# Patient Record
Sex: Female | Born: 1980 | Race: Black or African American | Hispanic: No | Marital: Single | Smoking: Current every day smoker
Health system: Southern US, Community
[De-identification: ages and names within clinical notes are randomized; demographics above are authoritative.]

## PROBLEM LIST (undated history)

## (undated) DIAGNOSIS — F1010 Alcohol abuse, uncomplicated: Secondary | ICD-10-CM

## (undated) DIAGNOSIS — E079 Disorder of thyroid, unspecified: Secondary | ICD-10-CM

---

## 2021-08-07 ENCOUNTER — Emergency Department (HOSPITAL_BASED_OUTPATIENT_CLINIC_OR_DEPARTMENT_OTHER): Payer: Self-pay

## 2021-08-07 ENCOUNTER — Encounter (HOSPITAL_BASED_OUTPATIENT_CLINIC_OR_DEPARTMENT_OTHER): Payer: Self-pay | Admitting: Emergency Medicine

## 2021-08-07 ENCOUNTER — Emergency Department (HOSPITAL_COMMUNITY): Payer: Self-pay

## 2021-08-07 ENCOUNTER — Other Ambulatory Visit: Payer: Self-pay

## 2021-08-07 ENCOUNTER — Emergency Department (HOSPITAL_BASED_OUTPATIENT_CLINIC_OR_DEPARTMENT_OTHER)
Admission: EM | Admit: 2021-08-07 | Discharge: 2021-08-08 | Disposition: A | Discharge status: Home / Self Care | Payer: Self-pay | Attending: Emergency Medicine | Admitting: Emergency Medicine

## 2021-08-07 DIAGNOSIS — R7401 Elevation of levels of liver transaminase levels: Secondary | ICD-10-CM | POA: Insufficient documentation

## 2021-08-07 DIAGNOSIS — R7309 Other abnormal glucose: Secondary | ICD-10-CM | POA: Insufficient documentation

## 2021-08-07 DIAGNOSIS — H5350 Unspecified color vision deficiencies: Secondary | ICD-10-CM

## 2021-08-07 DIAGNOSIS — F1010 Alcohol abuse, uncomplicated: Secondary | ICD-10-CM | POA: Insufficient documentation

## 2021-08-07 DIAGNOSIS — R296 Repeated falls: Secondary | ICD-10-CM | POA: Insufficient documentation

## 2021-08-07 DIAGNOSIS — E039 Hypothyroidism, unspecified: Secondary | ICD-10-CM | POA: Insufficient documentation

## 2021-08-07 DIAGNOSIS — R946 Abnormal results of thyroid function studies: Secondary | ICD-10-CM | POA: Insufficient documentation

## 2021-08-07 DIAGNOSIS — I10 Essential (primary) hypertension: Secondary | ICD-10-CM | POA: Insufficient documentation

## 2021-08-07 DIAGNOSIS — R251 Tremor, unspecified: Secondary | ICD-10-CM | POA: Insufficient documentation

## 2021-08-07 DIAGNOSIS — R202 Paresthesia of skin: Secondary | ICD-10-CM | POA: Insufficient documentation

## 2021-08-07 DIAGNOSIS — R200 Anesthesia of skin: Secondary | ICD-10-CM | POA: Insufficient documentation

## 2021-08-07 DIAGNOSIS — H539 Unspecified visual disturbance: Secondary | ICD-10-CM | POA: Insufficient documentation

## 2021-08-07 DIAGNOSIS — F1011 Alcohol abuse, in remission: Secondary | ICD-10-CM

## 2021-08-07 DIAGNOSIS — Y908 Blood alcohol level of 240 mg/100 ml or more: Secondary | ICD-10-CM | POA: Insufficient documentation

## 2021-08-07 HISTORY — DX: Disorder of thyroid, unspecified: E07.9

## 2021-08-07 LAB — HEPATIC FUNCTION PANEL
ALT: 108 U/L — ABNORMAL HIGH (ref 0–44)
AST: 287 U/L — ABNORMAL HIGH (ref 15–41)
Albumin: 3.8 g/dL (ref 3.5–5.0)
Alkaline Phosphatase: 171 U/L — ABNORMAL HIGH (ref 38–126)
Bilirubin, Direct: 0.2 mg/dL (ref 0.0–0.2)
Indirect Bilirubin: 0.4 mg/dL (ref 0.3–0.9)
Total Bilirubin: 0.6 mg/dL (ref 0.3–1.2)
Total Protein: 7.8 g/dL (ref 6.5–8.1)

## 2021-08-07 LAB — CBC WITH DIFFERENTIAL/PLATELET
Abs Immature Granulocytes: 0.01 10*3/uL (ref 0.00–0.07)
Basophils Absolute: 0.1 10*3/uL (ref 0.0–0.1)
Basophils Relative: 2 %
Eosinophils Absolute: 0.3 10*3/uL (ref 0.0–0.5)
Eosinophils Relative: 7 %
HCT: 36.7 % (ref 36.0–46.0)
Hemoglobin: 13 g/dL (ref 12.0–15.0)
Immature Granulocytes: 0 %
Lymphocytes Relative: 46 %
Lymphs Abs: 1.8 10*3/uL (ref 0.7–4.0)
MCH: 37.5 pg — ABNORMAL HIGH (ref 26.0–34.0)
MCHC: 35.4 g/dL (ref 30.0–36.0)
MCV: 105.8 fL — ABNORMAL HIGH (ref 80.0–100.0)
Monocytes Absolute: 0.5 10*3/uL (ref 0.1–1.0)
Monocytes Relative: 12 %
Neutro Abs: 1.3 10*3/uL — ABNORMAL LOW (ref 1.7–7.7)
Neutrophils Relative %: 33 %
Platelets: 176 10*3/uL (ref 150–400)
RBC: 3.47 MIL/uL — ABNORMAL LOW (ref 3.87–5.11)
RDW: 14.6 % (ref 11.5–15.5)
WBC: 3.8 10*3/uL — ABNORMAL LOW (ref 4.0–10.5)
nRBC: 0.5 % — ABNORMAL HIGH (ref 0.0–0.2)

## 2021-08-07 LAB — RAPID URINE DRUG SCREEN, HOSP PERFORMED
Amphetamines: NOT DETECTED
Barbiturates: NOT DETECTED
Benzodiazepines: NOT DETECTED
Cocaine: NOT DETECTED
Opiates: NOT DETECTED
Tetrahydrocannabinol: NOT DETECTED

## 2021-08-07 LAB — CBC
HCT: 36.4 % (ref 36.0–46.0)
Hemoglobin: 13 g/dL (ref 12.0–15.0)
MCH: 37.2 pg — ABNORMAL HIGH (ref 26.0–34.0)
MCHC: 35.7 g/dL (ref 30.0–36.0)
MCV: 104.3 fL — ABNORMAL HIGH (ref 80.0–100.0)
Platelets: 171 10*3/uL (ref 150–400)
RBC: 3.49 MIL/uL — ABNORMAL LOW (ref 3.87–5.11)
RDW: 14.4 % (ref 11.5–15.5)
WBC: 4.1 10*3/uL (ref 4.0–10.5)
nRBC: 0 % (ref 0.0–0.2)

## 2021-08-07 LAB — TSH: TSH: 5.975 u[IU]/mL — ABNORMAL HIGH (ref 0.350–4.500)

## 2021-08-07 LAB — BASIC METABOLIC PANEL
Anion gap: 11 (ref 5–15)
BUN: 5 mg/dL — ABNORMAL LOW (ref 6–20)
CO2: 24 mmol/L (ref 22–32)
Calcium: 9 mg/dL (ref 8.9–10.3)
Chloride: 103 mmol/L (ref 98–111)
Creatinine, Ser: 0.6 mg/dL (ref 0.44–1.00)
GFR, Estimated: >60 mL/min (ref >60)
Glucose, Bld: 106 mg/dL — ABNORMAL HIGH (ref 70–99)
Potassium: 3.9 mmol/L (ref 3.5–5.1)
Sodium: 138 mmol/L (ref 135–145)

## 2021-08-07 LAB — PREGNANCY, URINE: Preg Test, Ur: NEGATIVE

## 2021-08-07 LAB — VITAMIN B12: Vitamin B-12: 910 pg/mL (ref 180–914)

## 2021-08-07 LAB — TROPONIN I (HIGH SENSITIVITY)
Troponin I (High Sensitivity): 3 ng/L (ref <18)
Troponin I (High Sensitivity): 3 ng/L (ref <18)

## 2021-08-07 LAB — ETHANOL: Alcohol, Ethyl (B): 253 mg/dL — ABNORMAL HIGH (ref <10)

## 2021-08-07 LAB — AMMONIA: Ammonia: 16 umol/L (ref 9–35)

## 2021-08-07 IMAGING — MR MR THORACIC SPINE WO/W CM
5 of 9 series · 19 of 48 positions shown · IV contrast (gadavist)
Comparison: None.

CLINICAL DATA: Multiple sclerosis

EXAM:
MRI HEAD WITHOUT AND WITH CONTRAST
MRI CERVICAL SPINE WITHOUT AND WITH CONTRAST
MRI CERVICAL THORACIC WITHOUT AND CONTRAST
CONTRAST:  4.5mL GADAVIST GADOBUTROL 1 MMOL/ML IV SOLN
TECHNIQUE: Multiplanar, multiecho pulse sequences of the brain and surrounding
structures, and cervical and thoracic spine were obtained without
and with intravenous contrast.

[Series 17: T1 · sagittal · 3.0mm · 0.90mm/px · 2 of 16 slices shown (1 of 3)]
[im 1/16]
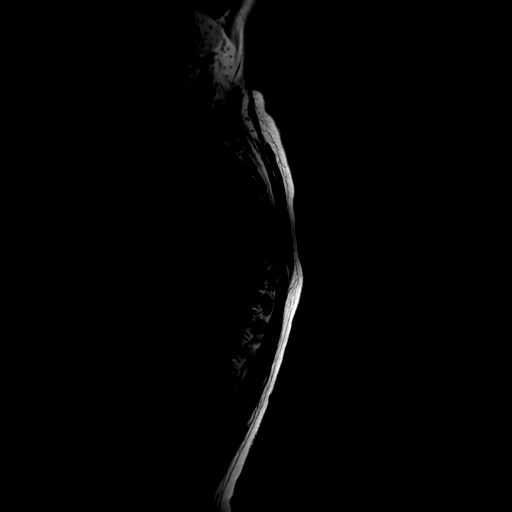
[im 16/16]
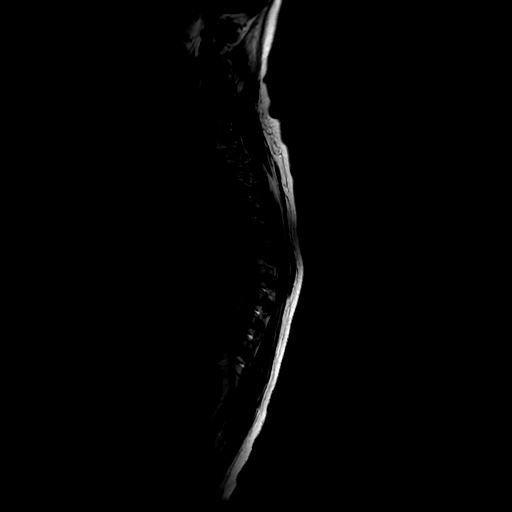

[Series 18: T2 · sagittal · 3.0mm · 0.59mm/px · 2 of 15 slices shown (1 of 2)]
[im 1/15]
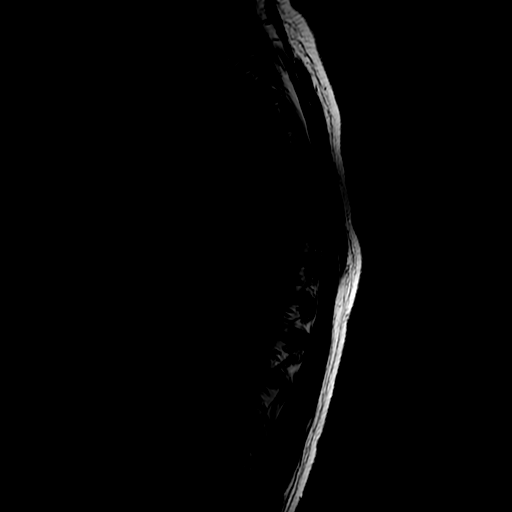
[im 15/15]
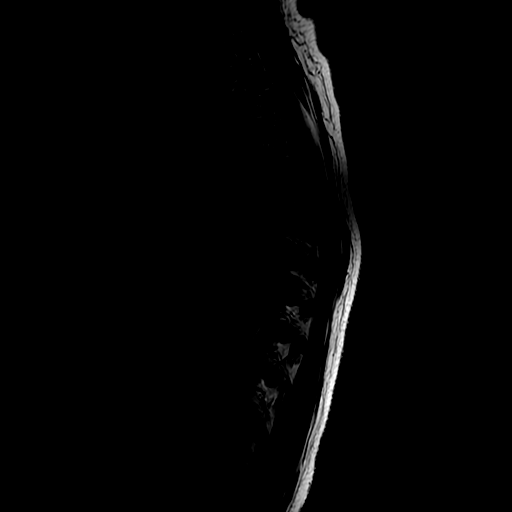

[Series 20: T1 · sagittal · 3.0mm · 0.59mm/px · 3 of 15 slices shown (2 of 3)]
[im 1/15]
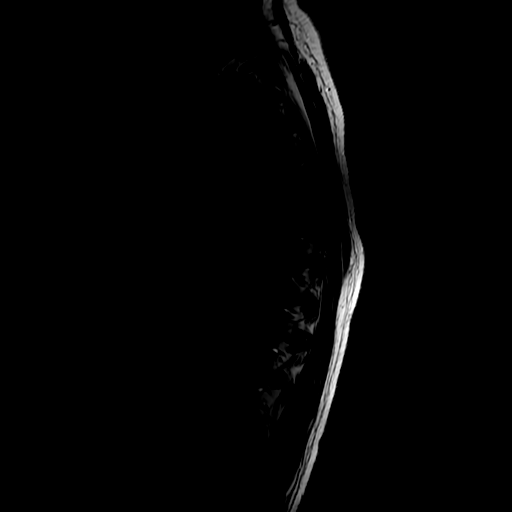
[im 8/15]
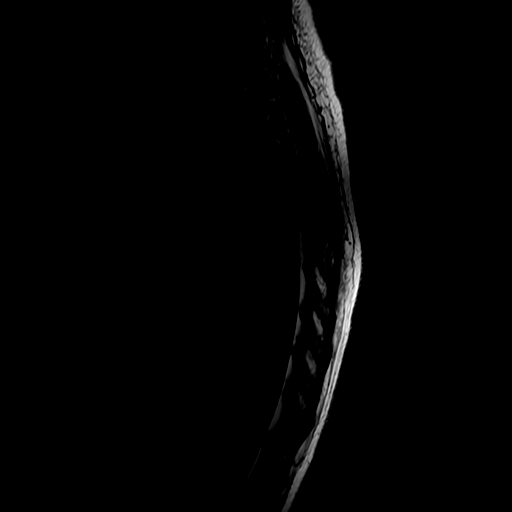
[im 15/15]
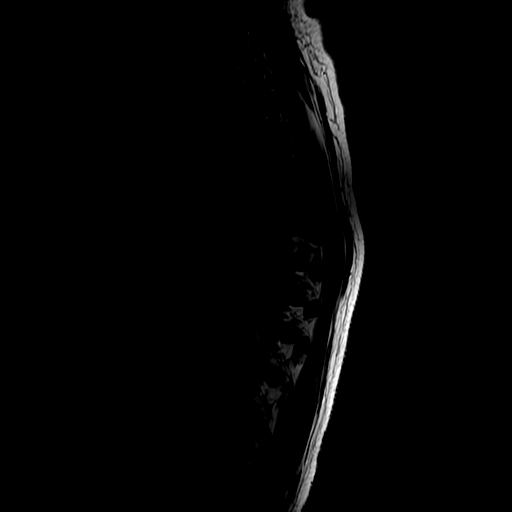

[Series 21: T2 · axial · 4.0mm · 0.39mm/px · z∈[-393,-135]mm · 9 of 49 slices shown (2 of 2)]
[im 1/49]
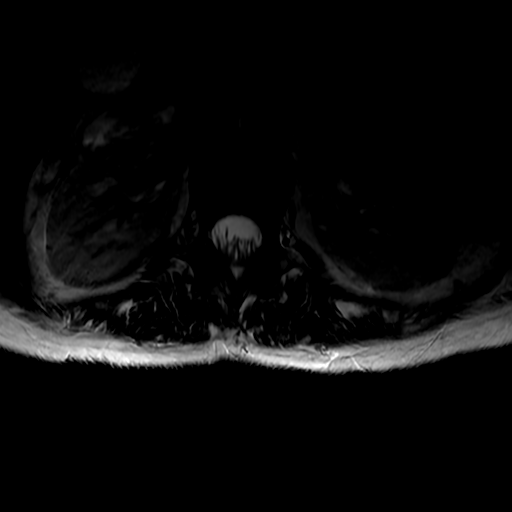
[im 7/49]
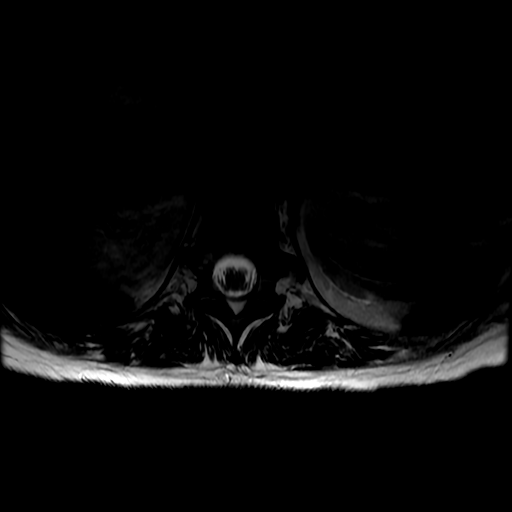
[im 13/49]
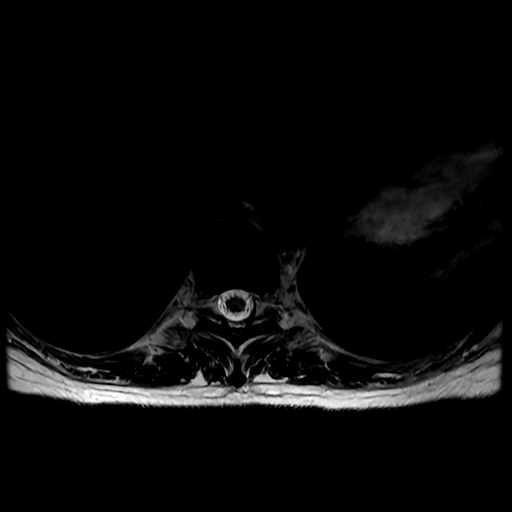
[im 19/49]
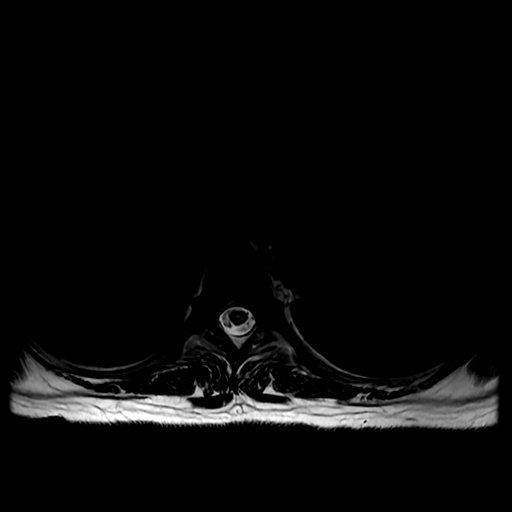
[im 25/49]
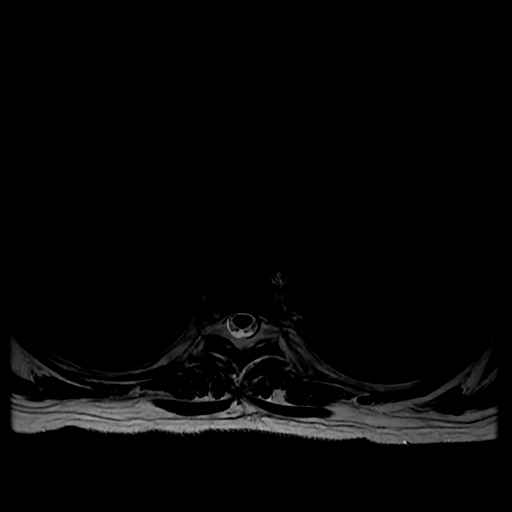
[im 31/49]
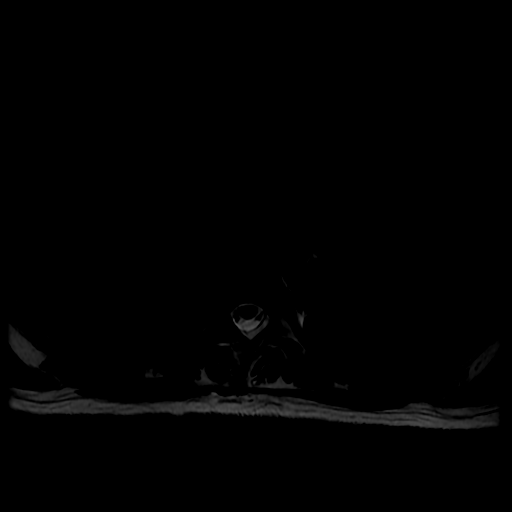
[im 37/49]
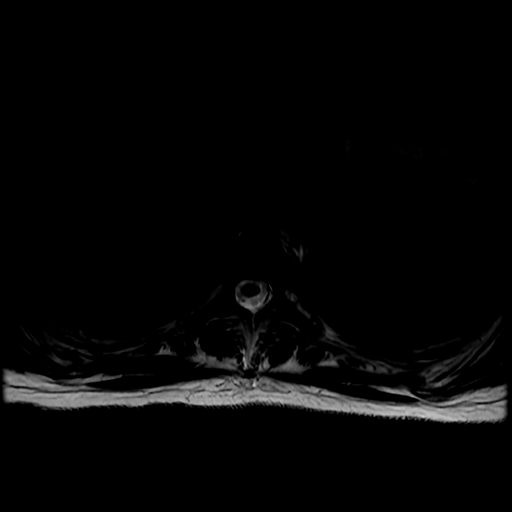
[im 43/49]
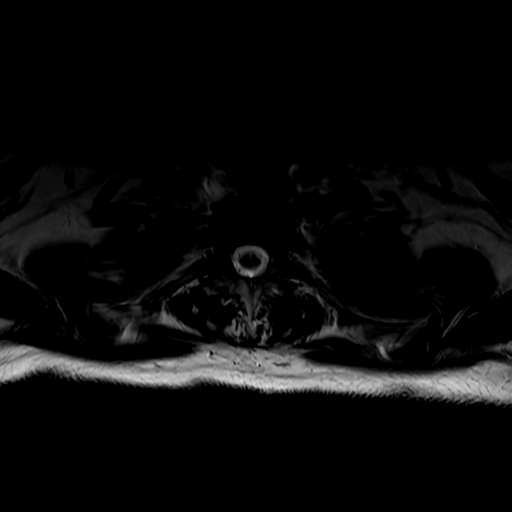
[im 49/49]
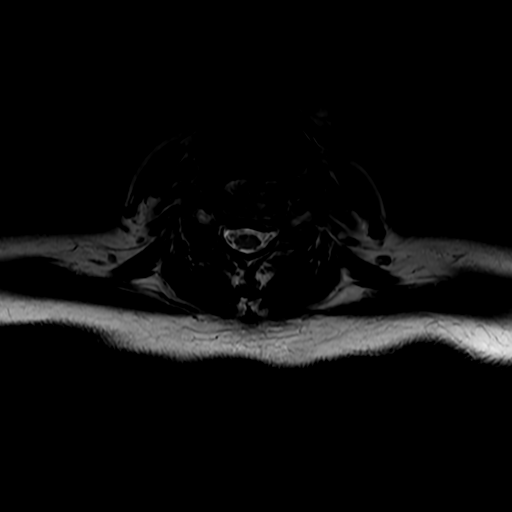

[Series 23: T1 · axial · non-contrast · 4.0mm · 0.39mm/px · z∈[-393,-328]mm · 3 of 49 slices shown (3 of 3)]
[im 1/49]
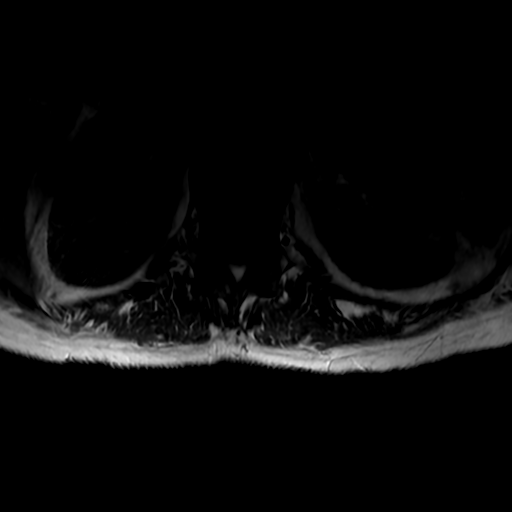
[im 7/49]
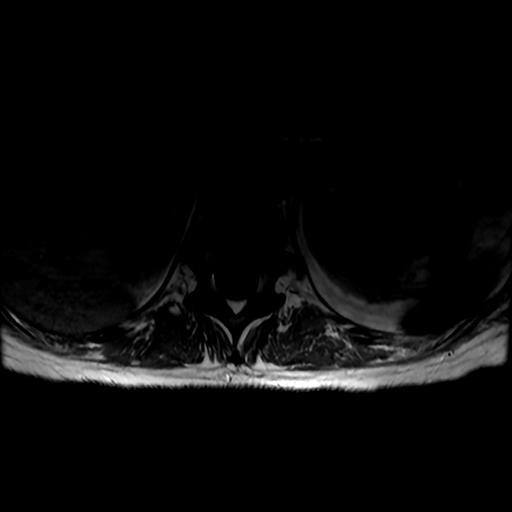
[im 13/49]
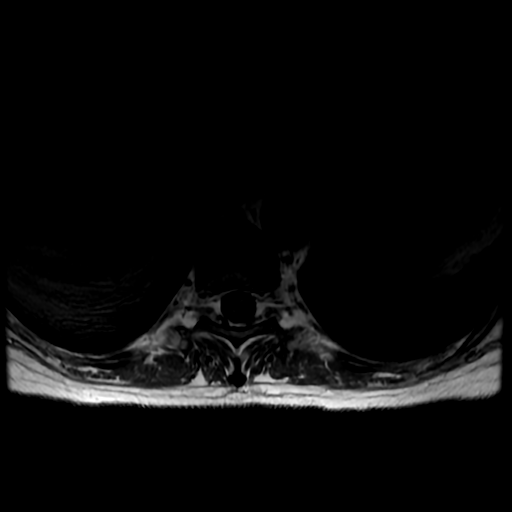

[19 of 48 positions shown; findings below may reference images not displayed]

FINDINGS: MRI HEAD FINDINGS

Brain: No acute infarct, mass effect or extra-axial collection. No
acute or chronic hemorrhage. Normal white matter signal, parenchymal
volume and CSF spaces. The midline structures are normal.

Vascular: Major flow voids are preserved.

Skull and upper cervical spine: Normal calvarium and skull base.
Visualized upper cervical spine and soft tissues are normal.

Sinuses/Orbits:No paranasal sinus fluid levels or advanced mucosal
thickening. No mastoid or middle ear effusion. Normal orbits.

MRI CERVICAL SPINE FINDINGS

Alignment: Reversal of normal cervical lordosis

Vertebrae: Type 1 Modic changes at C5-7.  No acute abnormality.

Cord: Normal signal and morphology.

Posterior Fossa, vertebral arteries, paraspinal tissues: Negative.

Disc levels: There is no spinal canal stenosis or neural
impingement. Degenerative disc disease is greatest at C5-6 and C6-7.

MRI THORACIC SPINE FINDINGS

Alignment:  Physiologic.

Vertebrae: No fracture, evidence of discitis, or bone lesion.

Cord:  Normal signal and morphology.

Paraspinal and other soft tissues: Negative.

Disc levels: No spinal canal or neural foraminal stenosis.
IMPRESSION: 1. Normal MRI of the brain.
2. No evidence of demyelinating disease within the cervical or
thoracic spinal cord.
3. Mild C5-6 and C6-7 degenerative disc disease without spinal canal
or neural foraminal stenosis.

## 2021-08-07 IMAGING — MR MR CERVICAL SPINE WO/W CM
5 of 8 series · 19 of 48 positions shown · IV contrast (gadavist)
Comparison: None.

CLINICAL DATA: Multiple sclerosis

EXAM:
MRI HEAD WITHOUT AND WITH CONTRAST
MRI CERVICAL SPINE WITHOUT AND WITH CONTRAST
MRI CERVICAL THORACIC WITHOUT AND CONTRAST
CONTRAST:  4.5mL GADAVIST GADOBUTROL 1 MMOL/ML IV SOLN
TECHNIQUE: Multiplanar, multiecho pulse sequences of the brain and surrounding
structures, and cervical and thoracic spine were obtained without
and with intravenous contrast.

[Series 10: T2 · sagittal · 3.0mm · 0.31mm/px · 3 of 17 slices shown (1 of 2)]
[im 1/17]
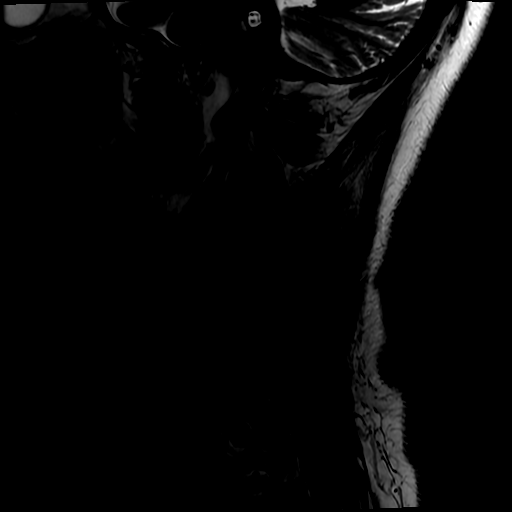
[im 9/17]
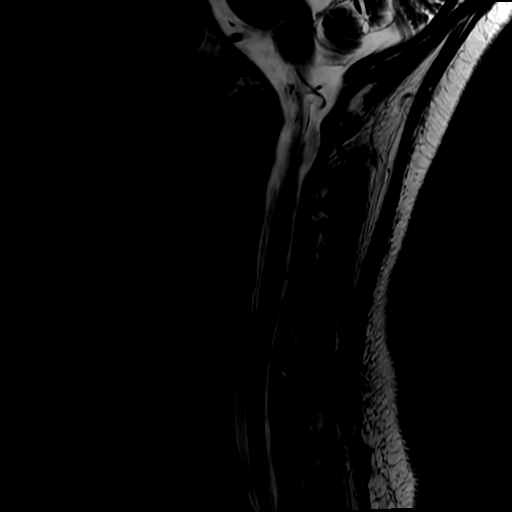
[im 17/17]
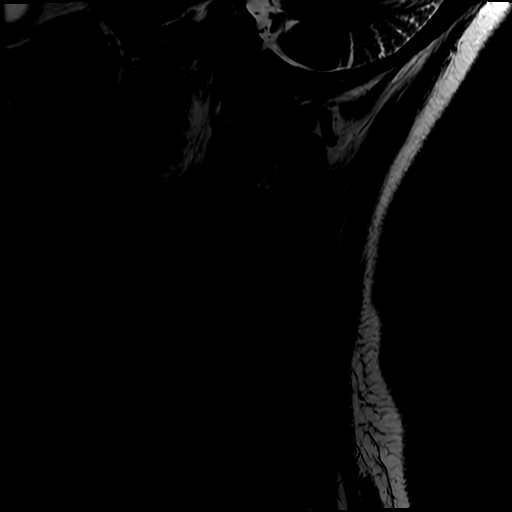

[Series 12: STIR · sagittal · 3.0mm · 0.31mm/px · 1 of 17 slices shown]
[im 1/17]
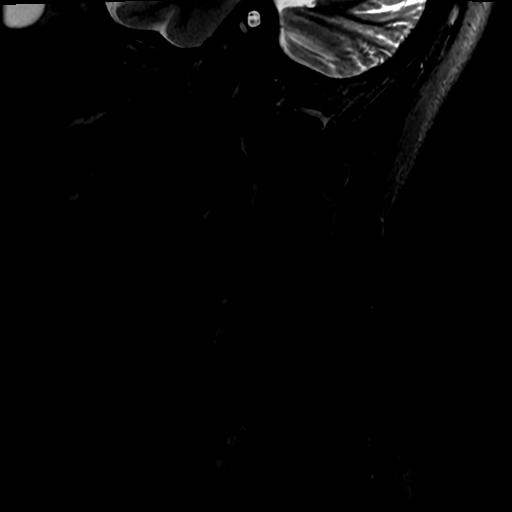

[Series 14: T2 · axial · 3.0mm · 0.35mm/px · z∈[-162,-57]mm · 6 of 32 slices shown (2 of 2)]
[im 1/32]
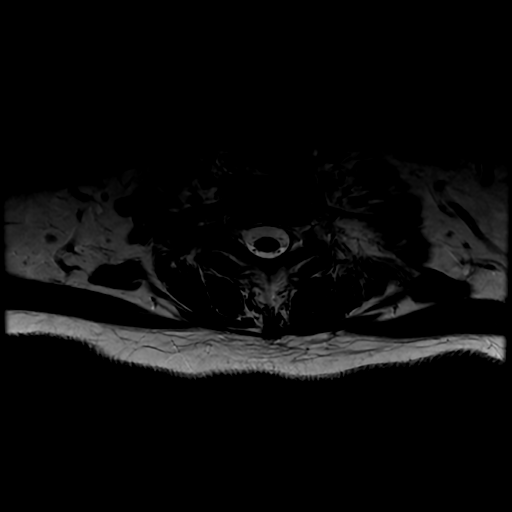
[im 7/32]
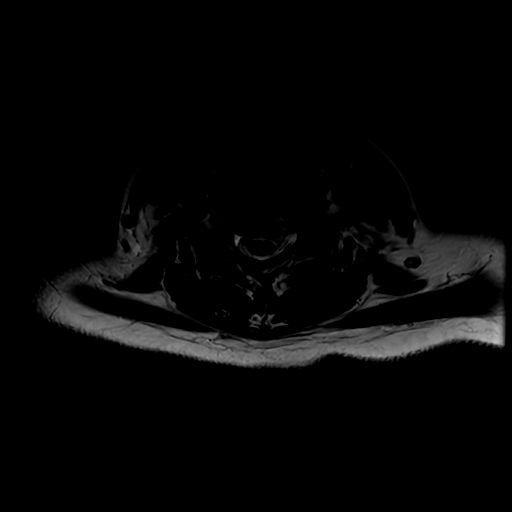
[im 13/32]
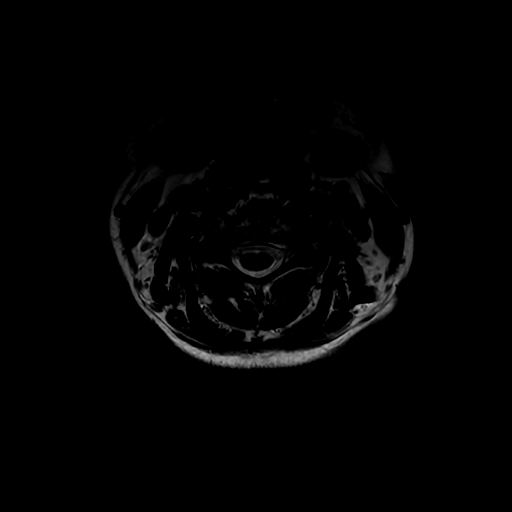
[im 19/32]
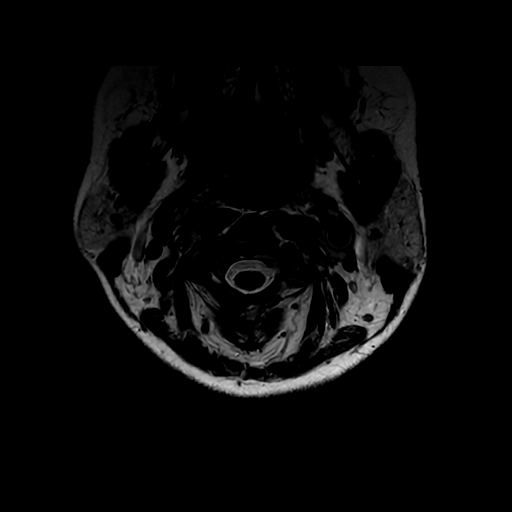
[im 25/32]
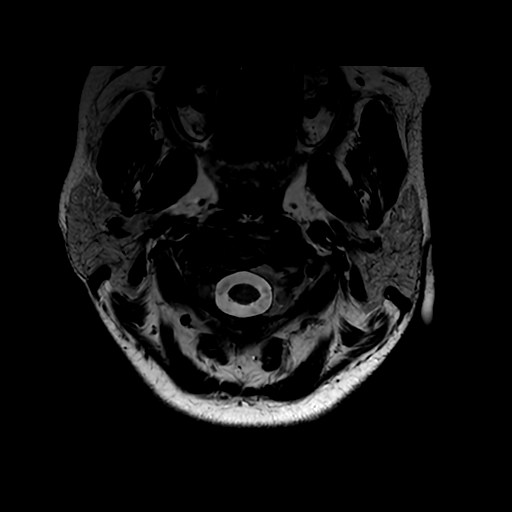
[im 32/32]
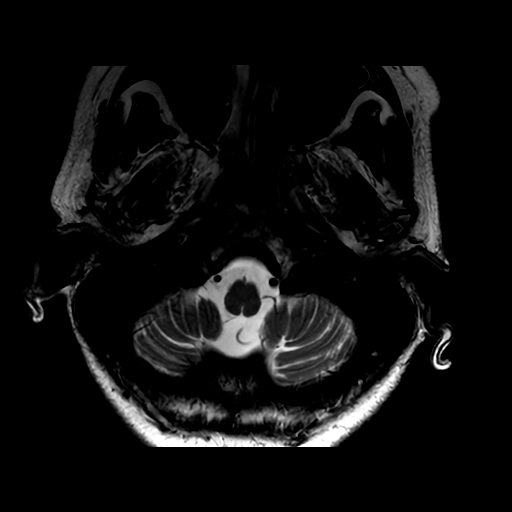

[Series 15: T1 · axial · non-contrast · 3.0mm · 0.35mm/px · z∈[-162,-57]mm · 6 of 34 slices shown]
[im 1/34]
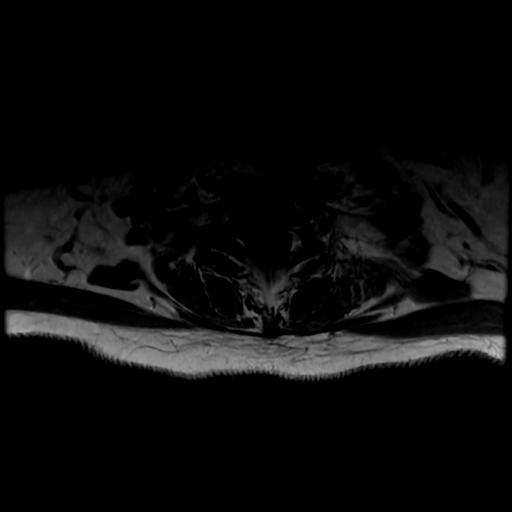
[im 7/34]
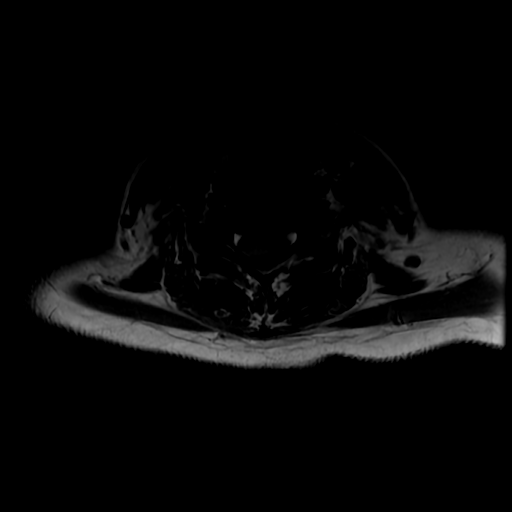
[im 14/34]
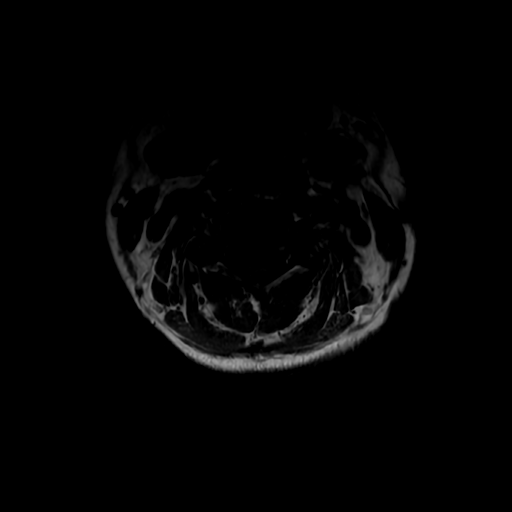
[im 20/34]
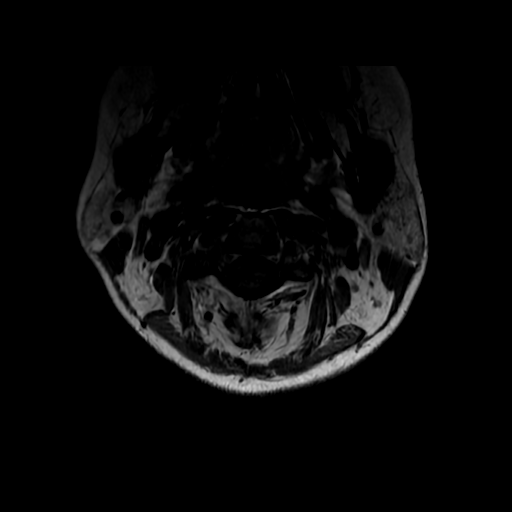
[im 27/34]
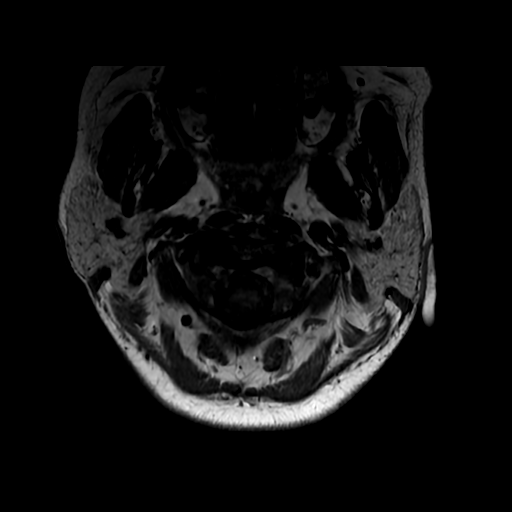
[im 34/34]
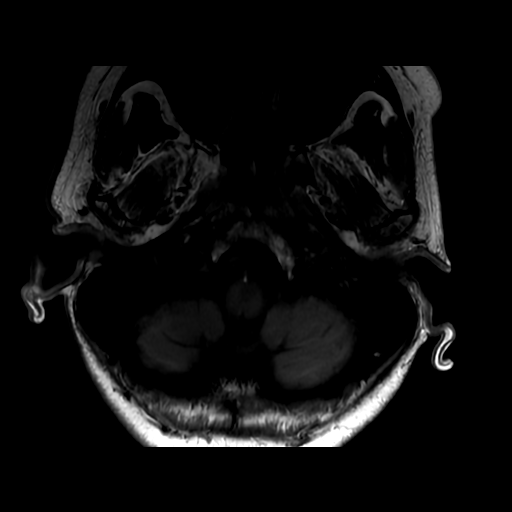

[Series 26: T1 fat-sat post-contrast · sagittal · 3.0mm · 0.31mm/px · 3 of 17 slices shown]
[im 1/17]
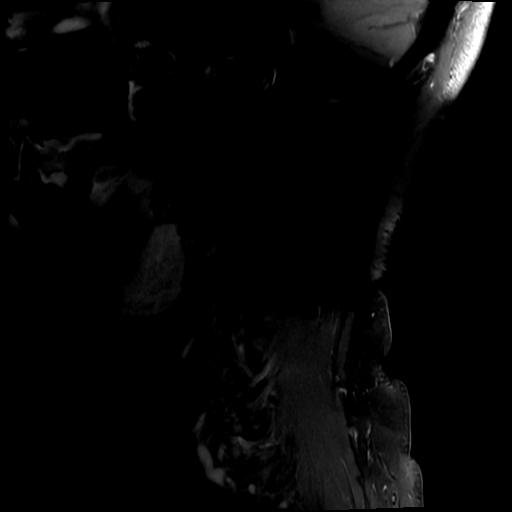
[im 9/17]
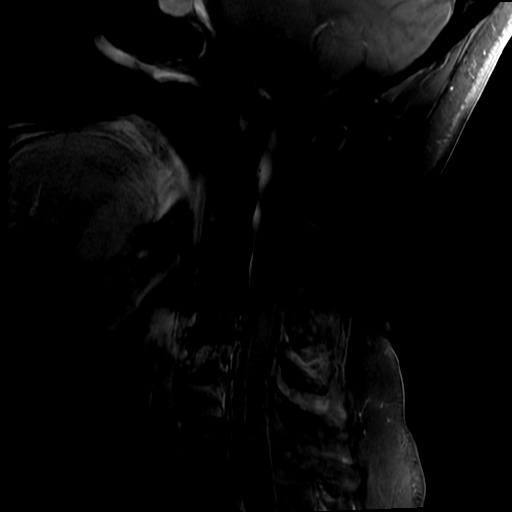
[im 17/17]
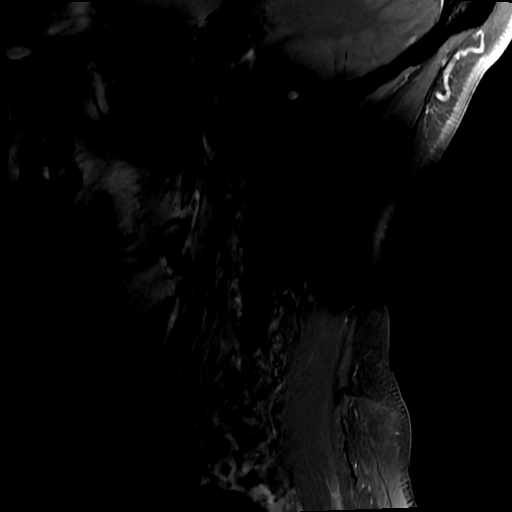

[19 of 48 positions shown; findings below may reference images not displayed]

FINDINGS: MRI HEAD FINDINGS

Brain: No acute infarct, mass effect or extra-axial collection. No
acute or chronic hemorrhage. Normal white matter signal, parenchymal
volume and CSF spaces. The midline structures are normal.

Vascular: Major flow voids are preserved.

Skull and upper cervical spine: Normal calvarium and skull base.
Visualized upper cervical spine and soft tissues are normal.

Sinuses/Orbits:No paranasal sinus fluid levels or advanced mucosal
thickening. No mastoid or middle ear effusion. Normal orbits.

MRI CERVICAL SPINE FINDINGS

Alignment: Reversal of normal cervical lordosis

Vertebrae: Type 1 Modic changes at C5-7.  No acute abnormality.

Cord: Normal signal and morphology.

Posterior Fossa, vertebral arteries, paraspinal tissues: Negative.

Disc levels: There is no spinal canal stenosis or neural
impingement. Degenerative disc disease is greatest at C5-6 and C6-7.

MRI THORACIC SPINE FINDINGS

Alignment:  Physiologic.

Vertebrae: No fracture, evidence of discitis, or bone lesion.

Cord:  Normal signal and morphology.

Paraspinal and other soft tissues: Negative.

Disc levels: No spinal canal or neural foraminal stenosis.
IMPRESSION: 1. Normal MRI of the brain.
2. No evidence of demyelinating disease within the cervical or
thoracic spinal cord.
3. Mild C5-6 and C6-7 degenerative disc disease without spinal canal
or neural foraminal stenosis.

## 2021-08-07 IMAGING — MR MR HEAD WO/W CM
7 of 13 series · 22 of 48 positions shown · IV contrast (gadavist)
Comparison: None.

CLINICAL DATA: Multiple sclerosis

EXAM:
MRI HEAD WITHOUT AND WITH CONTRAST
MRI CERVICAL SPINE WITHOUT AND WITH CONTRAST
MRI CERVICAL THORACIC WITHOUT AND CONTRAST
CONTRAST:  4.5mL GADAVIST GADOBUTROL 1 MMOL/ML IV SOLN
TECHNIQUE: Multiplanar, multiecho pulse sequences of the brain and surrounding
structures, and cervical and thoracic spine were obtained without
and with intravenous contrast.

[Series 2: DWI · axial · 3.0mm · 0.94mm/px · z∈[-74,+60]mm · 7 of 100 slices shown (1 of 2)]
[im 1/100]
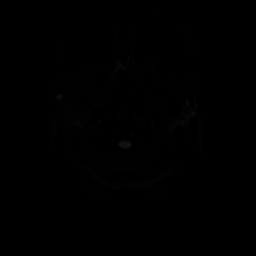
[im 17/100]
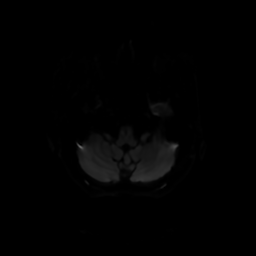
[im 34/100]
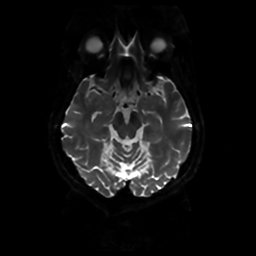
[im 50/100]
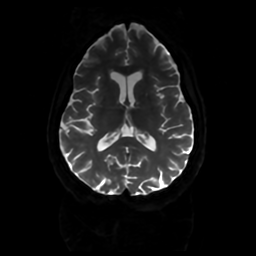
[im 67/100]
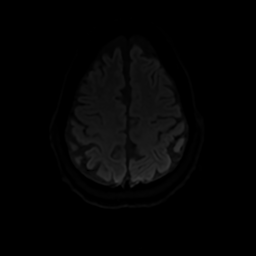
[im 83/100]
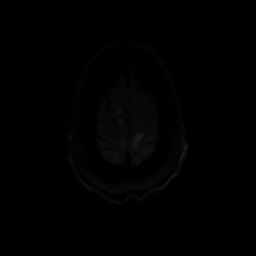
[im 100/100]
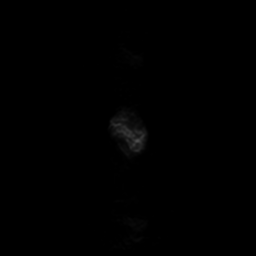

[Series 3: DWI · coronal · 4.0mm · 0.94mm/px · 5 of 74 slices shown (2 of 2)]
[im 1/74]
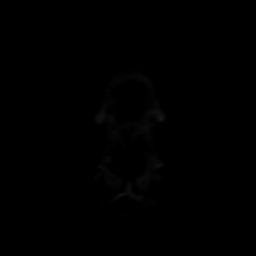
[im 19/74]
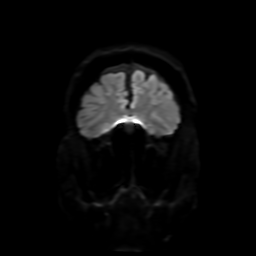
[im 37/74]
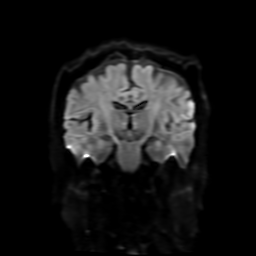
[im 55/74]
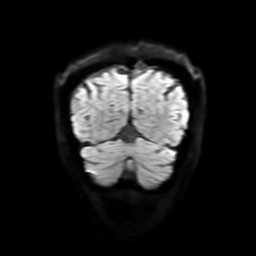
[im 74/74]
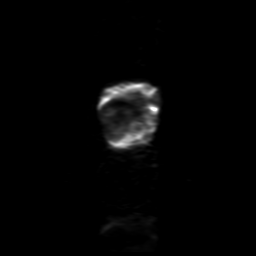

[Series 4: FLAIR · sagittal · 5.0mm · 0.23mm/px · 2 of 24 slices shown (1 of 2)]
[im 1/24]
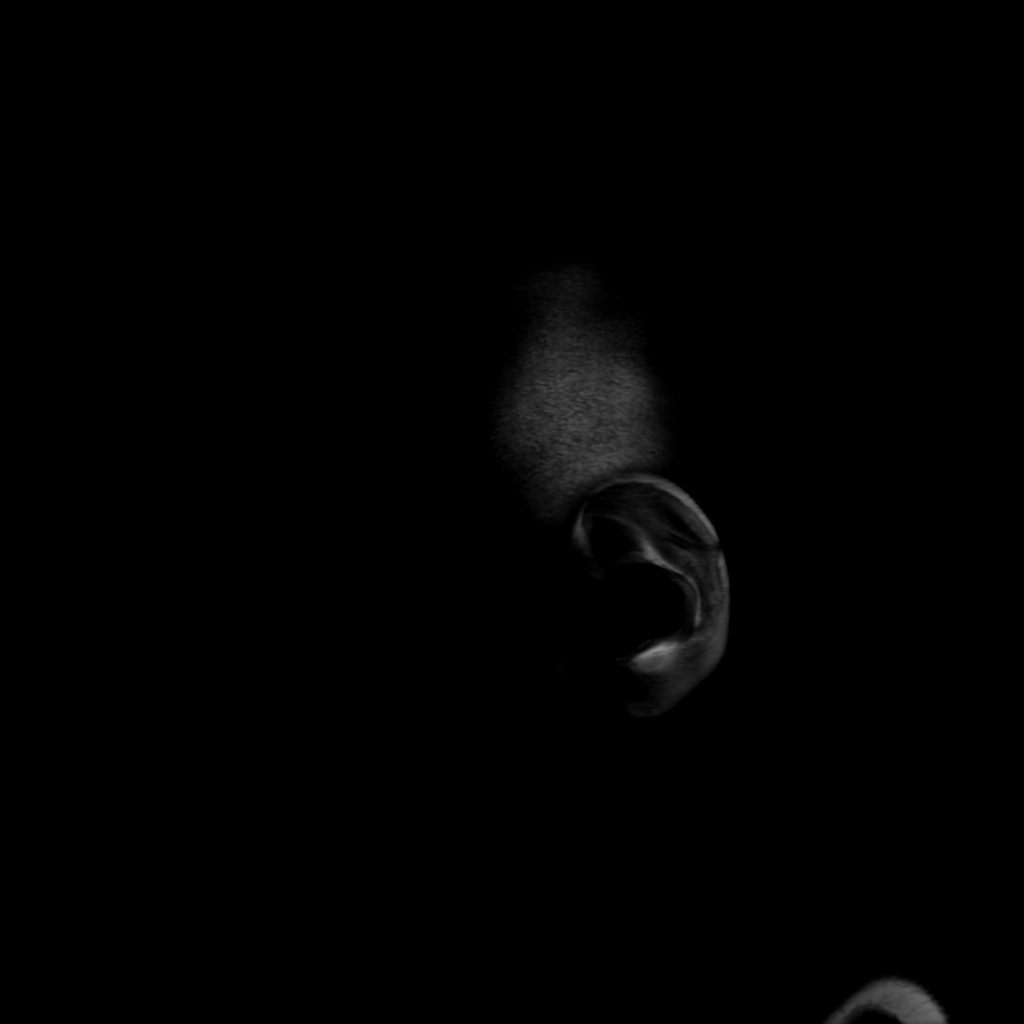
[im 24/24]
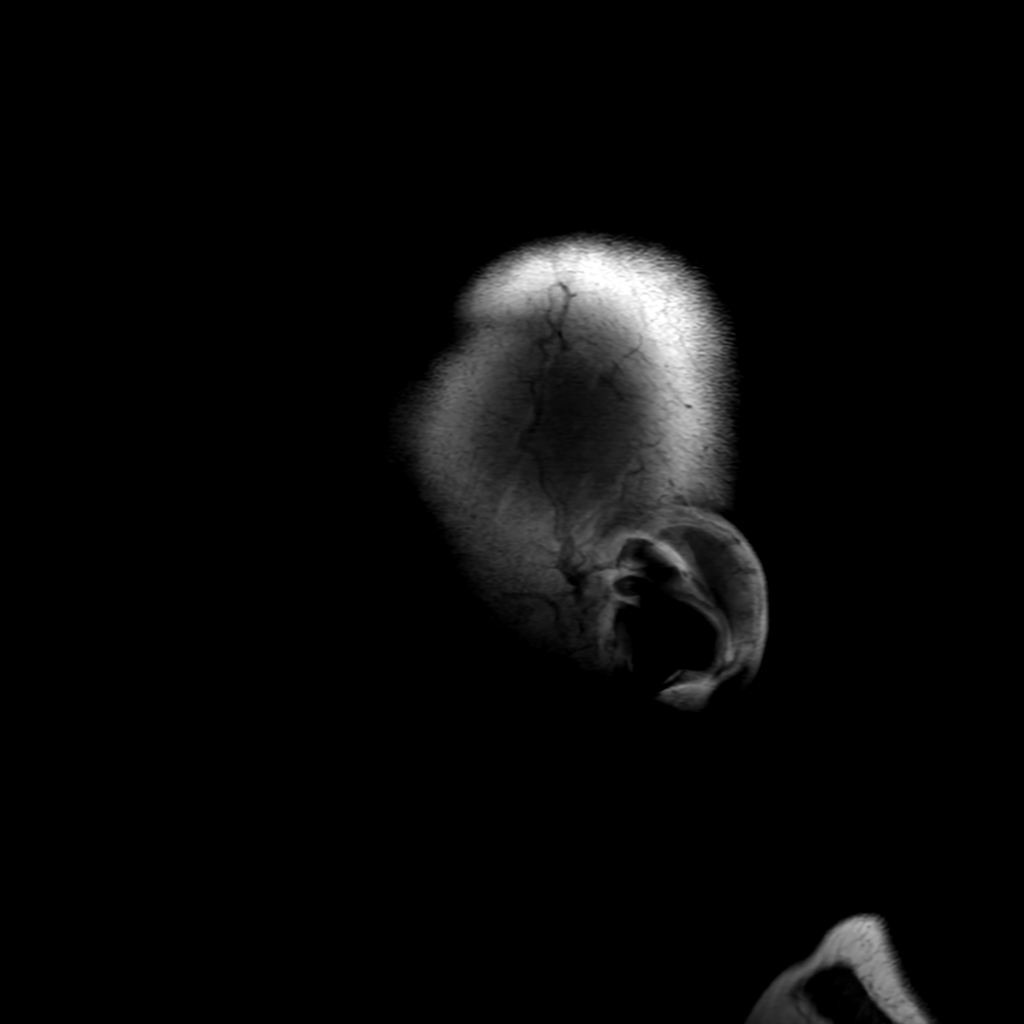

[Series 5: T2 · axial · 5.0mm · 0.23mm/px · 1 of 26 slices shown]
[im 1/26]
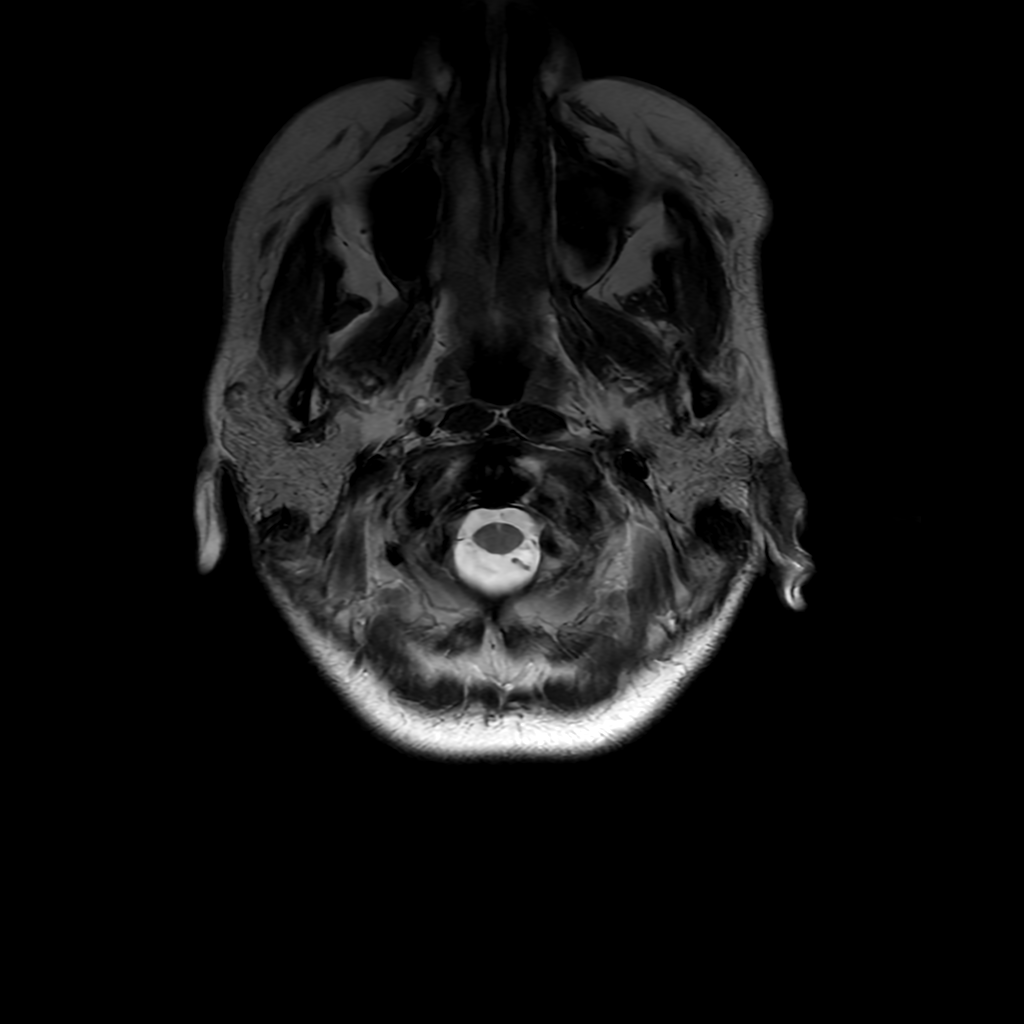

[Series 6: FLAIR · axial · 4.0mm · 0.45mm/px · z∈[-75,+62]mm · 2 of 35 slices shown (2 of 2)]
[im 1/35]
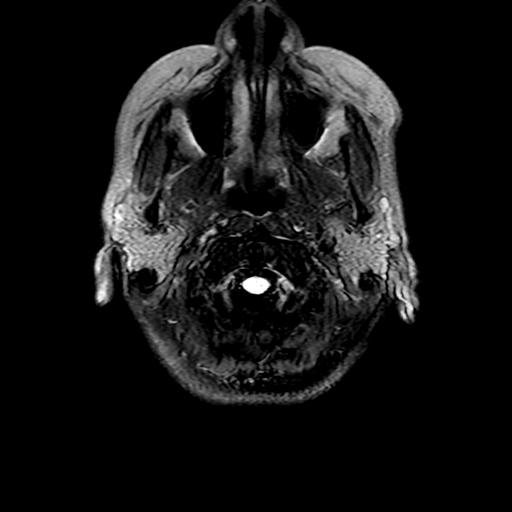
[im 35/35]
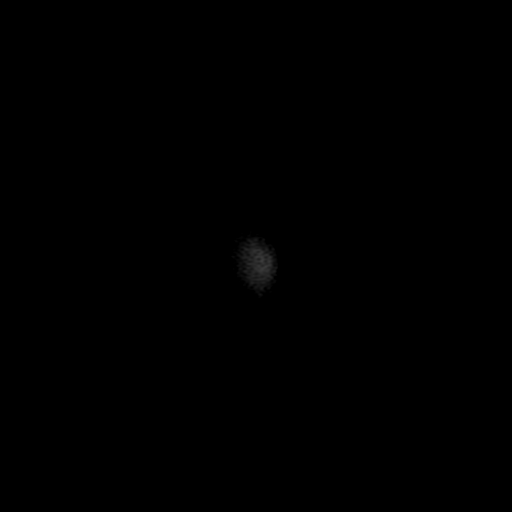

[Series 250: ADC · axial · 3.0mm · 0.94mm/px · z∈[-74,+60]mm · 3 of 50 slices shown (1 of 2)]
[im 1/50]
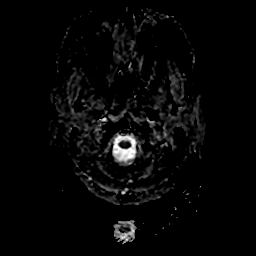
[im 25/50]
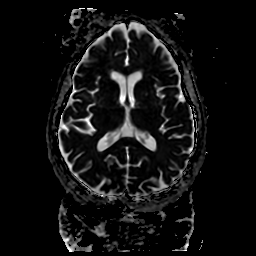
[im 50/50]
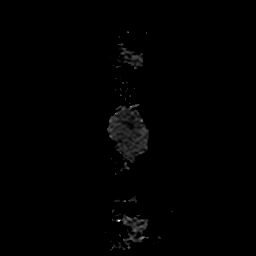

[Series 350: ADC · coronal · 4.0mm · 0.94mm/px · 2 of 37 slices shown (2 of 2)]
[im 1/37]
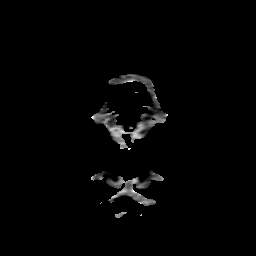
[im 37/37]
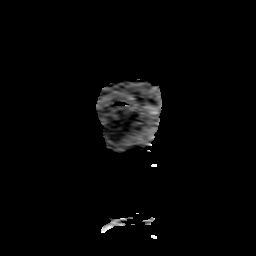

[22 of 48 positions shown; findings below may reference images not displayed]

FINDINGS: MRI HEAD FINDINGS

Brain: No acute infarct, mass effect or extra-axial collection. No
acute or chronic hemorrhage. Normal white matter signal, parenchymal
volume and CSF spaces. The midline structures are normal.

Vascular: Major flow voids are preserved.

Skull and upper cervical spine: Normal calvarium and skull base.
Visualized upper cervical spine and soft tissues are normal.

Sinuses/Orbits:No paranasal sinus fluid levels or advanced mucosal
thickening. No mastoid or middle ear effusion. Normal orbits.

MRI CERVICAL SPINE FINDINGS

Alignment: Reversal of normal cervical lordosis

Vertebrae: Type 1 Modic changes at C5-7.  No acute abnormality.

Cord: Normal signal and morphology.

Posterior Fossa, vertebral arteries, paraspinal tissues: Negative.

Disc levels: There is no spinal canal stenosis or neural
impingement. Degenerative disc disease is greatest at C5-6 and C6-7.

MRI THORACIC SPINE FINDINGS

Alignment:  Physiologic.

Vertebrae: No fracture, evidence of discitis, or bone lesion.

Cord:  Normal signal and morphology.

Paraspinal and other soft tissues: Negative.

Disc levels: No spinal canal or neural foraminal stenosis.
IMPRESSION: 1. Normal MRI of the brain.
2. No evidence of demyelinating disease within the cervical or
thoracic spinal cord.
3. Mild C5-6 and C6-7 degenerative disc disease without spinal canal
or neural foraminal stenosis.

## 2021-08-07 IMAGING — CT CT HEAD W/O CM
3 series · 16 of 47 positions shown, 19 images · non-contrast
Comparison: None.

CLINICAL DATA: Falls, head trauma, vision changes



[Series 2: head wo · axial · 0.41mm/px · z∈[-167,-27]mm · 10 of 34 slices shown, 13 images]
[im 3/34  brain]
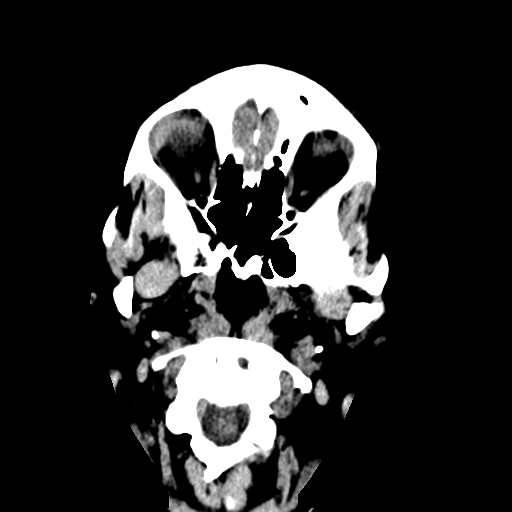
[im 3/34  bone]
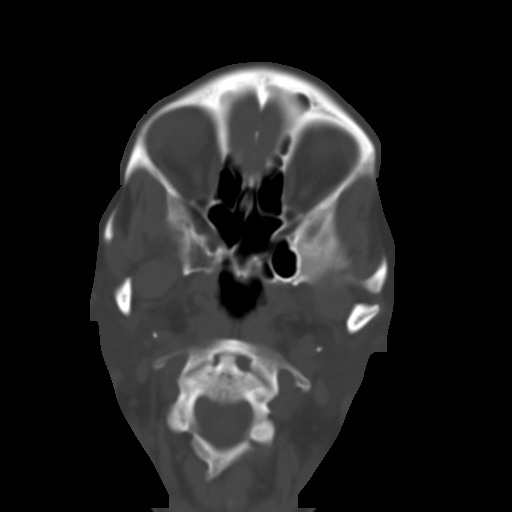
[im 6/34  brain]
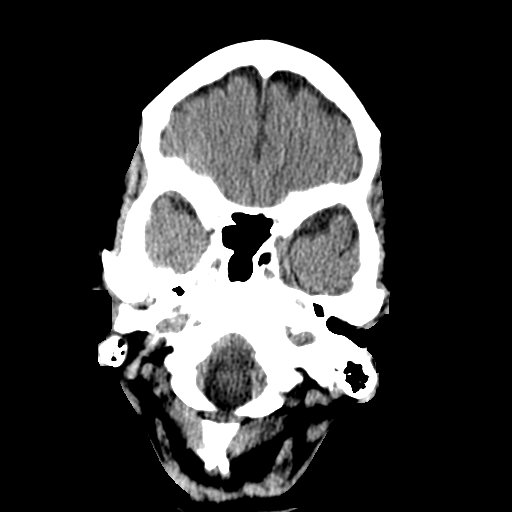
[im 10/34  brain]
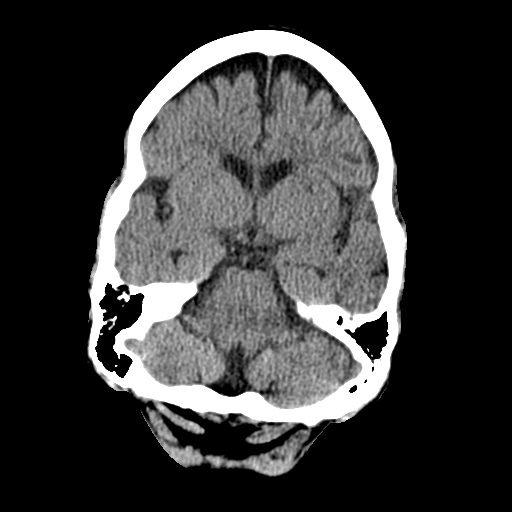
[im 12/34  brain]
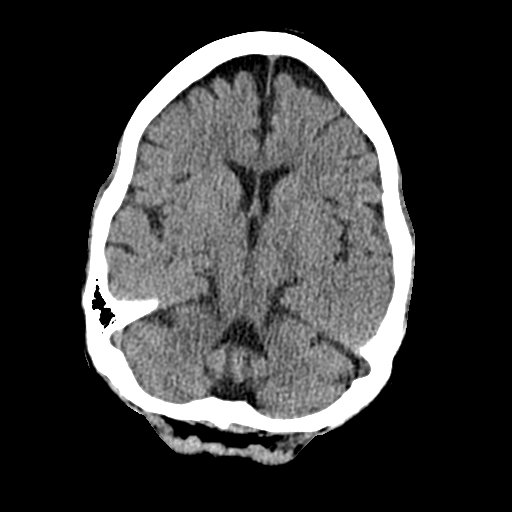
[im 15/34  brain]
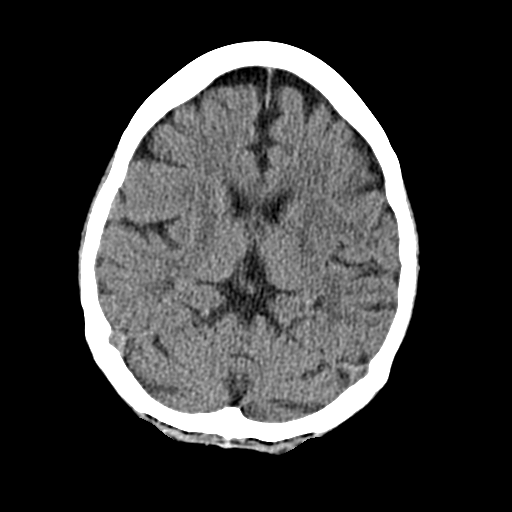
[im 15/34  bone]
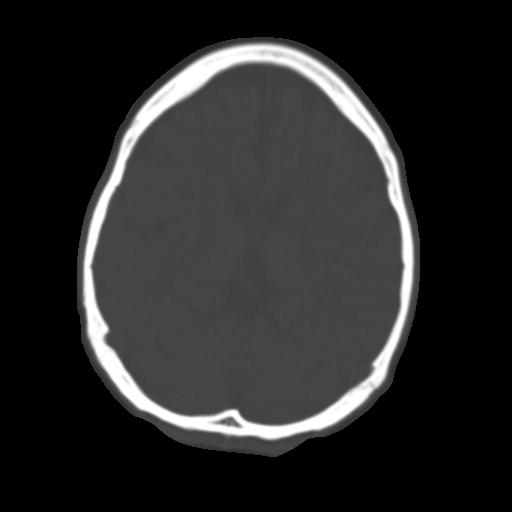
[im 19/34  brain]
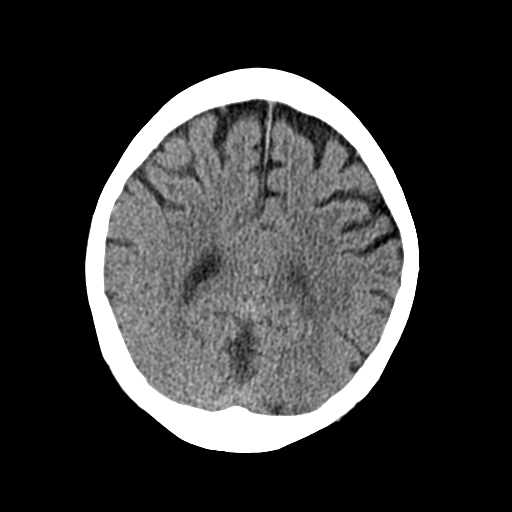
[im 22/34  brain]
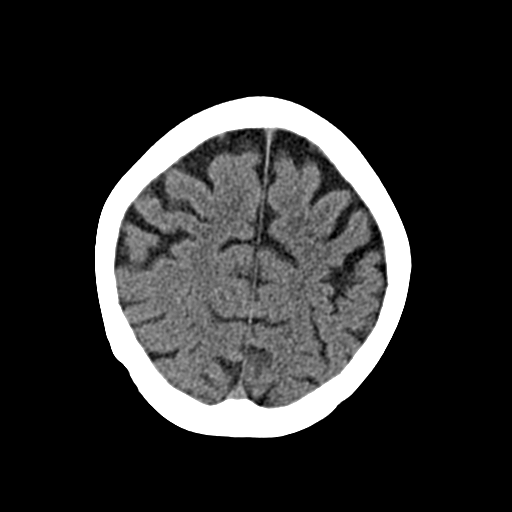
[im 26/34  brain]
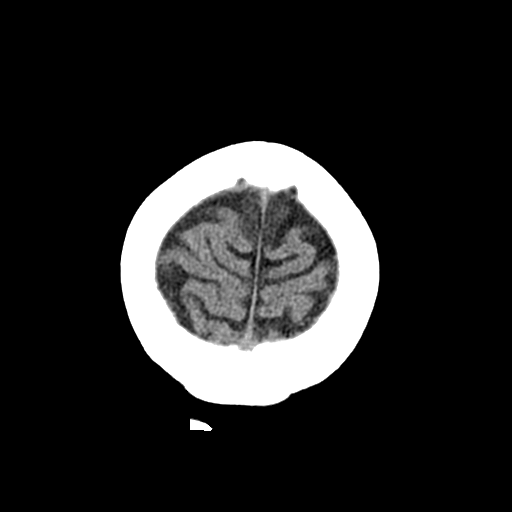
[im 28/34  brain]
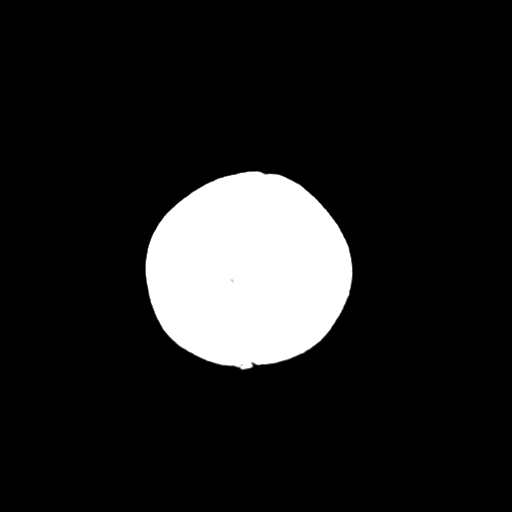
[im 28/34  bone]
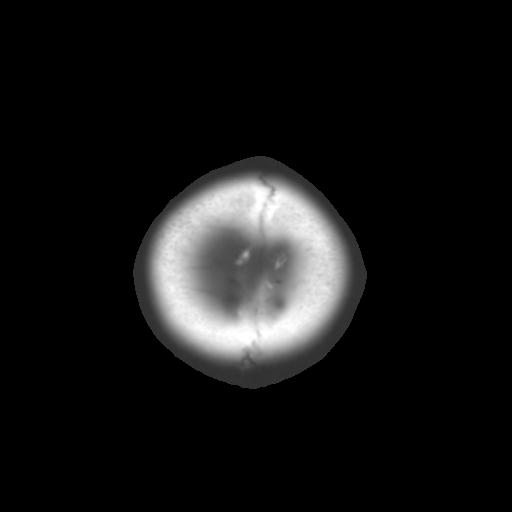
[im 31/34  brain]
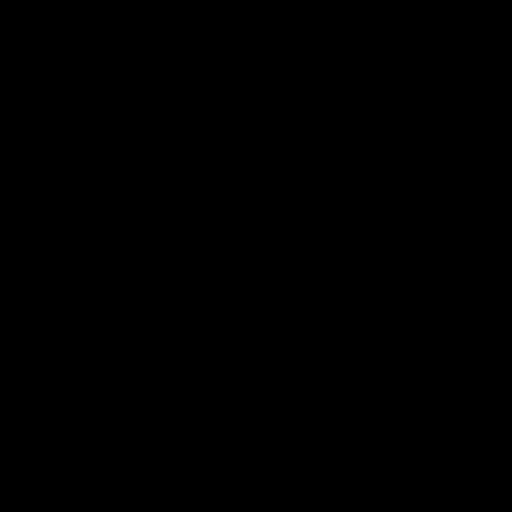

[Series 4: cor soft · coronal · 0.33mm/px · 3 of 64 slices shown]
[im 22/64  brain]
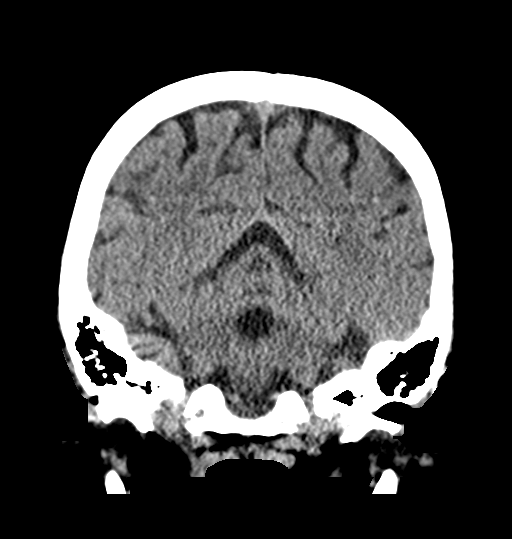
[im 29/64  brain]
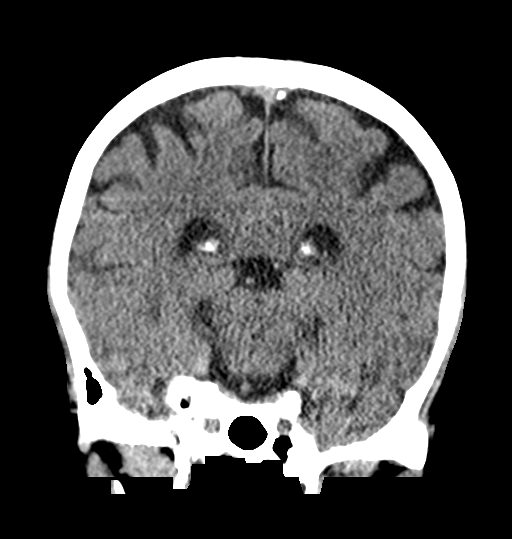
[im 36/64  brain]
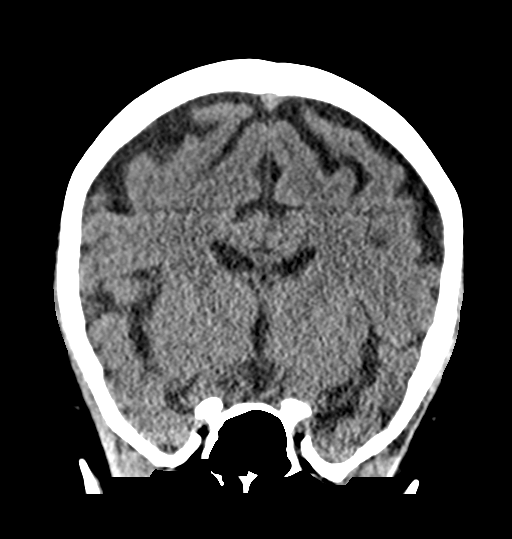

[Series 5: sag soft · sagittal · 0.34mm/px · 3 of 53 slices shown]
[im 18/53  brain]
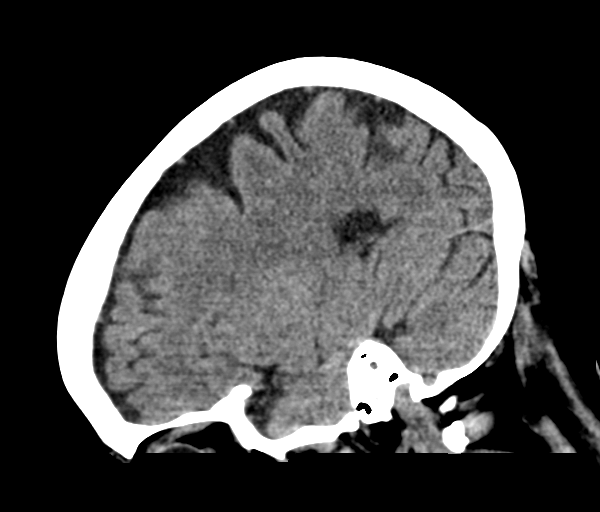
[im 27/53  brain]
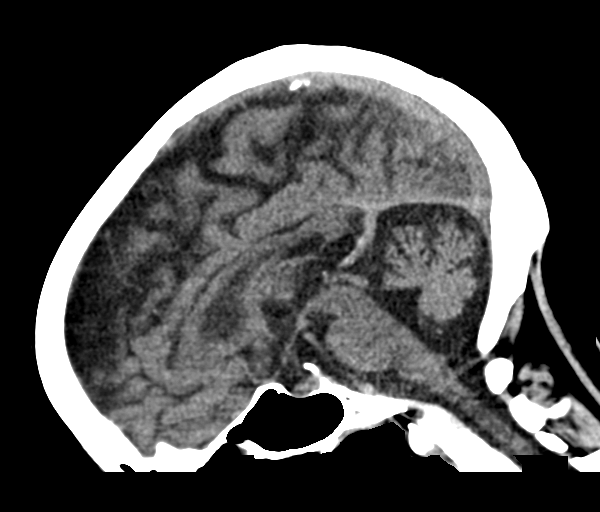
[im 35/53  brain]
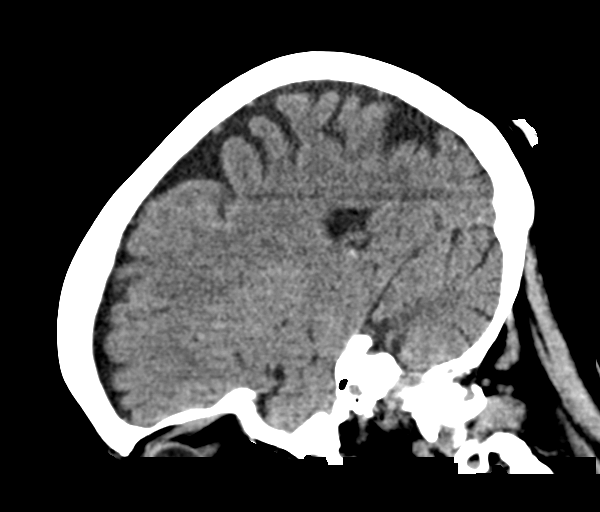

[16 of 47 positions shown; findings below may reference images not displayed]

FINDINGS: Brain: There is no evidence of acute intracranial hemorrhage,
extra-axial fluid collection, or acute infarct.

Parenchymal volume is normal. The ventricles are normal in size.
Gray-white differentiation is preserved.

There is no mass lesion.  There is no mass effect or midline shift.

Vascular: No hyperdense vessel or unexpected calcification.

Skull: Normal. Negative for fracture or focal lesion.

Sinuses/Orbits: Imaged paranasal sinuses are clear. The imaged
globes and orbits are unremarkable.

Other: None.
IMPRESSION: Normal head CT.

## 2021-08-07 MED ORDER — LORAZEPAM 2 MG/ML IJ SOLN
1.0000 mg | Freq: Once | INTRAMUSCULAR | Status: AC
  Administered 2021-08-07: 1 mg via INTRAVENOUS
  Filled 2021-08-07: qty 1

## 2021-08-07 MED ORDER — THIAMINE HCL 100 MG PO TABS: 100.0000 mg | ORAL_TABLET | Freq: Every day | ORAL | Status: DC

## 2021-08-07 MED ORDER — GADOBUTROL 1 MMOL/ML IV SOLN
4.5000 mL | Freq: Once | INTRAVENOUS | Status: AC | PRN
  Administered 2021-08-07: 4.5 mL via INTRAVENOUS

## 2021-08-07 MED ORDER — LORAZEPAM 2 MG/ML IJ SOLN: 0.0000 mg | Freq: Two times a day (BID) | INTRAMUSCULAR | Status: DC

## 2021-08-07 MED ORDER — THIAMINE HCL 100 MG/ML IJ SOLN
100.0000 mg | Freq: Every day | INTRAMUSCULAR | Status: DC
  Administered 2021-08-07: 100 mg via INTRAVENOUS
  Filled 2021-08-07: qty 2

## 2021-08-07 MED ORDER — LORAZEPAM 1 MG PO TABS: 0.0000 mg | ORAL_TABLET | Freq: Four times a day (QID) | ORAL | Status: DC

## 2021-08-07 MED ORDER — LORAZEPAM 2 MG/ML IJ SOLN
0.0000 mg | Freq: Four times a day (QID) | INTRAMUSCULAR | Status: DC
  Administered 2021-08-07: 1 mg via INTRAVENOUS
  Filled 2021-08-07: qty 1

## 2021-08-07 MED ORDER — CHLORDIAZEPOXIDE HCL 25 MG PO CAPS: ORAL_CAPSULE | ORAL | 0 refills | Status: DC

## 2021-08-07 MED ORDER — LORAZEPAM 1 MG PO TABS: 0.0000 mg | ORAL_TABLET | Freq: Two times a day (BID) | ORAL | Status: DC

## 2021-08-07 NOTE — ED Provider Notes (Signed)
[facility] EMERGENCY DEPARTMENT Provider Note   CSN: [id number] Arrival date & time: 08/07/21  1019     History  Chief Complaint  Patient presents with   Multiple Complaints    Kendra Frost is a 41 y.o. female.  HPI     41yo female with history of alcohol abuse, hypothyroidism, presents with concern for progressive bilateral lower extremity numbness, vision changes (looking through fog/lack of color vision.)  2 weeks ago woke up with different vision, can't see color, like can't see colors on the tv, can see some color up close, like can see that bedrail is blue, different vision, color distinction. Last May saw eye doctor, go for regular check ups, glasses, doesn't have hx of macular degeneration  In July of last year began to have numbness in toes, went from uncomfortable to painful, having falls now because of it, thought it would maybe go away but getting worse, now has numbness through shins bilaterally, now hurts to stand, for 2-3 weeks, began to have falls over months, thinks has had concussion, had a bad fall a few months ago, has had multiple falls since then, lives with brother, who feels like she doesn't remember things, is nauseas chronically, not recently having headaches but they do come off an on, sometimes will have migraines with photophobia  No double vision, missing pieces of vision, like a fog in front of vision and color change, black and white  Neck pain, back pain, side pain, for a few months, worse recently some depending on fall.  Left ribs hurt after recent fall 4 days ago.  No shortness of breath, brother reports she sometimes reports it but not sure if from pain.   No fever Cough comes and goes Nausea, vomiting sometimes multiple times a day for month, diarrhea also for months  Today having pain bilateral feet, legs, left side, overall body aches  Hx of thyroid disease on medicine, sometimes misses doses  No known family hx of MS, early  CVA Cigarettes, a few glasses etoh daily No other drugs Has had some itchiness, shaking with etoh withdrawal  Rx folic acid and vitamin D Moved here from [place]-was rx by PCP in [place], moved before thanksgiving     Past Medical History:  Diagnosis Date   Thyroid disease      Home Medications Prior to Admission medications   Not on File      Allergies    Patient has no allergy information on record.    Review of Systems   Review of Systems See above Physical Exam Updated Vital Signs BP 112/84   Pulse 86   Temp 97.7 F (36.5 C) (Oral)   Resp 18   Ht 5\' 1"  (1.549 m)   Wt 47.6 kg   LMP 07/23/2021   SpO2 100%   BMI 19.84 kg/m  Physical Exam Constitutional:      General: She is not in acute distress.    Appearance: Normal appearance. She is not ill-appearing.  HENT:     Head: Normocephalic and atraumatic.  Eyes:     General: No visual field deficit.    Extraocular Movements: Extraocular movements intact.     Conjunctiva/sclera: Conjunctivae normal.     Pupils: Pupils are equal, round, and reactive to light.  Cardiovascular:     Rate and Rhythm: Normal rate and regular rhythm.     Pulses: Normal pulses.  Pulmonary:     Effort: Pulmonary effort is normal. No respiratory distress.  Abdominal:  General: There is no distension.     Tenderness: There is no abdominal tenderness.  Musculoskeletal:        General: Tenderness (left lateral chest wall, no tenderness C/T/L) present. No swelling.     Cervical back: Normal range of motion.  Skin:    General: Skin is warm and dry.     Findings: No erythema or rash.  Neurological:     General: No focal deficit present.     Mental Status: She is alert and oriented to person, place, and time.     GCS: GCS eye subscore is 4. GCS verbal subscore is 5. GCS motor subscore is 6.     Cranial Nerves: No cranial nerve deficit, dysarthria or facial asymmetry.     Sensory: Sensory deficit (bilateral lower extremities up to  knee) present.     Motor: Tremor present. No weakness or pronator drift.     Coordination: Coordination normal. Finger-Nose-Finger Test normal.     Gait: Gait (deferred) normal.     Comments: Normal reflex left patellar, unable to obtain reflex on right patellar    ED Results / Procedures / Treatments   Labs (all labs ordered are listed, but only abnormal results are displayed) Labs Reviewed  BASIC METABOLIC PANEL - Abnormal; Notable for the following components:      Result Value   Glucose, Bld 106 (*)    BUN 5 (*)    All other components within normal limits  CBC - Abnormal; Notable for the following components:   RBC 3.49 (*)    MCV 104.3 (*)    MCH 37.2 (*)    All other components within normal limits  HEPATIC FUNCTION PANEL - Abnormal; Notable for the following components:   AST 287 (*)    ALT 108 (*)    Alkaline Phosphatase 171 (*)    All other components within normal limits  ETHANOL - Abnormal; Notable for the following components:   Alcohol, Ethyl (B) 253 (*)    All other components within normal limits  CBC WITH DIFFERENTIAL/PLATELET - Abnormal; Notable for the following components:   WBC 3.8 (*)    RBC 3.47 (*)    MCV 105.8 (*)    MCH 37.5 (*)    nRBC 0.5 (*)    Neutro Abs 1.3 (*)    All other components within normal limits  PREGNANCY, URINE  AMMONIA  RAPID URINE DRUG SCREEN, HOSP PERFORMED  VITAMIN B1  VITAMIN B12  RPR  TSH  TROPONIN I (HIGH SENSITIVITY)  TROPONIN I (HIGH SENSITIVITY)    EKG EKG Interpretation  Date/Time:  Monday August 07 2021 10:45:58 EDT Ventricular Rate:  91 PR Interval:  160 QRS Duration: 84 QT Interval:  382 QTC Calculation: 469 R Axis:   63 Text Interpretation: Normal sinus rhythm Normal ECG No previous ECGs available Confirmed by Alvira Monday ([id number]) on 08/07/2021 3:42:09 PM  Radiology DG Chest 2 View  Result Date: 08/07/2021 CLINICAL DATA:  Falls, left rib pain EXAM: CHEST - 2 VIEW COMPARISON:  None. FINDINGS: The  cardiomediastinal silhouette is normal. The lungs clear, with no focal consolidation or pulmonary edema. There is no pleural effusion or pneumothorax No displaced rib fracture is identified. IMPRESSION: No radiographic evidence of acute cardiopulmonary process. No displaced rib fracture identified. Electronically Signed   By: Lesia Hausen M.D.   On: 08/07/2021 15:02   CT Head Wo Contrast  Result Date: 08/07/2021 CLINICAL DATA:  Falls, head trauma, vision changes EXAM: CT HEAD WITHOUT  CONTRAST TECHNIQUE: Contiguous axial images were obtained from the base of the skull through the vertex without intravenous contrast. RADIATION DOSE REDUCTION: This exam was performed according to the departmental dose-optimization program which includes automated exposure control, adjustment of the mA and/or kV according to patient size and/or use of iterative reconstruction technique. COMPARISON:  None. FINDINGS: Brain: There is no evidence of acute intracranial hemorrhage, extra-axial fluid collection, or acute infarct. Parenchymal volume is normal. The ventricles are normal in size. Gray-white differentiation is preserved. There is no mass lesion.  There is no mass effect or midline shift. Vascular: No hyperdense vessel or unexpected calcification. Skull: Normal. Negative for fracture or focal lesion. Sinuses/Orbits: Imaged paranasal sinuses are clear. The imaged globes and orbits are unremarkable. Other: None. IMPRESSION: Normal head CT. Electronically Signed   By: Lesia Hausen M.D.   On: 08/07/2021 14:57    Procedures Procedures    Medications Ordered in ED Medications  LORazepam (ATIVAN) injection 1 mg (1 mg Intravenous Given 08/07/21 1611)    ED Course/ Medical Decision Making/ A&P                           Medical Decision Making Amount and/or Complexity of Data Reviewed Labs: ordered. Radiology: ordered.  Risk Prescription drug management.    41yo female with history of alcohol abuse, hypothyroidism,  presents with concern for progressive bilateral lower extremity numbness, vision changes (looking through fog/lack of color vision.)  DDx includes electrolyte abnormality, toxic/metabolic abnormality, vitamin deficiency, ICH, CVA, MS.     Regarding falls and trauma--last fall was 4 days ago.  Does not have midline C-spine tenderness, and denies neck injury or acute lower extremity neurologic change and have low suspicion for acute fracture at this time.  CT head was completed which showed no evidence of intracranial hemorrhage.  Does report some left rib pain, with x-ray completed showing no signs of pneumothorax.  Have low suspicion overall for occult fracture.  Labs are completed and evaluated by me and show no significant electrolyte abnormality.  CBC does show macrocytic RBCs, consistent with possible vitamin B12 or folate deficiency, mild leukopenia.  Troponin is negative as completed given left-sided chest pain which is likely related to rib contusion.  Pregnancy test negative.  Ammonia within normal limits and do not suspect hepatic encephalopathy.  Hepatic function panel shows elevated AST, ALT, and alk phos consistent with history of alcohol abuse.   Do not feel history is consistent with Guillain-Barr given duration and slow progression of numbness.  Did order TSH, vitamin B-12, thiamine, and RPR for further evaluation of her numbness.  While she initially denies any alcohol use today, her alcohol level is 253, and she does report significant use and concern for withdrawal and even developing some shakiness and withdrawal symptoms at time of reevaluation.  Discussed with Dr. Wilford Corner of neurology.  Will transfer to [facility] for an MRI with and without contrast of brain, cervical and thoracic spine.  We will also place CIWA protocol for continued observation.  Discussed recommendation for alcohol cessation and continued discussion.   We will transfer for continued evaluation in the emergency  department and disposition per results and reevaluation. She will require outpatient Neurology and PCP evaluation if determined stable for discharge.            Final Clinical Impression(s) / ED Diagnoses Final diagnoses:  Numbness and tingling of both legs  Visual color changes  History of alcohol  abuse  Falls frequently    Rx / DC Orders ED Discharge Orders     None         Alvira Monday, MD 08/07/21 1622

## 2021-08-07 NOTE — ED Notes (Signed)
Pt remains off the unit in MRI.  

## 2021-08-07 NOTE — Discharge Instructions (Addendum)
You were seen in the ER today for thet ingling in your legs. Your blood work showed some inflammation in your liver likely from your chronic alcohol use.  Your brain scans and the MRIs of your spine were very reassuring.  There is no evidence of any dangerous source for the symptoms you are experiencing.  Please follow-up in the outpatient setting with the neurologist as previously discussed. ?Additionally you have been prescribed Librium, which is a medication to assist you with detoxing from alcohol.  Please take it as prescribed and follow-up closely in the outpatient setting.  ? ?Return to the ER with any new severe symptoms ?

## 2021-08-07 NOTE — ED Provider Notes (Signed)
?[facility] ?Provider Note ? ? ?CSN: [id number] ?Arrival date & time: 08/07/21  1019 ? ?  ? ?History ? ?Chief Complaint  ?Patient presents with  ? Multiple Complaints  ? ? ?Kendra Frost is a 41 y.o. female. ?This patient was received in transfer from [facility] where she was initially evaluated by Dr. Dalene SeltzerSchlossman this evening.  [facility] for MRI of the brain, C-spine, and T-spine. ? ?In brief patient is a 41 year old female with chronic alcohol usage, hypothyroidism who presents with concern for bilateral lower extremity numbness and tingling progressively worsening since July 2022.  States over the last couple weeks she is also had some changes in her vision which she describes as looking through fog and desaturation of color. ? ?I personally reviewed this patient's medical records.  She walks with a cane at baseline and has had tremors for the last several months. ? ?HPI ? ?  ? ?Home Medications ?Prior to Admission medications   ?Medication Sig Start Date End Date Taking? Authorizing Provider  ?chlordiazePOXIDE (LIBRIUM) 25 MG capsule 50mg  PO TID x 1D, then 25-50mg  PO BID X 1D, then 25-50mg  PO QD X 1D 08/07/21  Yes Ximena Todaro R, PA-C  ?   ? ?Allergies    ?Patient has no allergy information on record.   ? ?Review of Systems   ?Review of Systems  ?Eyes:  Positive for visual disturbance.  ?Neurological:  Positive for weakness and numbness.  ?All other systems reviewed and are negative. ? ?Physical Exam ?Updated Vital Signs ?BP (!) 127/106   Pulse 87   Temp 97.7 ?F (36.5 ?C) (Oral)   Resp 16   Ht 5\' 1"  (1.549 m)   Wt 47.6 kg   LMP 07/23/2021   SpO2 100%   BMI 19.84 kg/m?  ?Physical Exam ?Vitals and nursing note reviewed.  ?Constitutional:   ?   Appearance: She is not ill-appearing or toxic-appearing.  ?HENT:  ?   Head: Normocephalic and atraumatic.  ?   Nose: Nose normal.  ?   Mouth/Throat:  ?   Mouth: Mucous membranes are moist.  ?    Pharynx: No oropharyngeal exudate or posterior oropharyngeal erythema.  ?Eyes:  ?   General:     ?   Right eye: No discharge.     ?   Left eye: No discharge.  ?   Extraocular Movements: Extraocular movements intact.  ?   Conjunctiva/sclera: Conjunctivae normal.  ?   Pupils: Pupils are equal, round, and reactive to light.  ?Cardiovascular:  ?   Rate and Rhythm: Normal rate and regular rhythm.  ?   Pulses: Normal pulses.  ?   Heart sounds: Normal heart sounds. No murmur heard. ?Pulmonary:  ?   Effort: Pulmonary effort is normal. No respiratory distress.  ?   Breath sounds: Normal breath sounds. No wheezing or rales.  ?Abdominal:  ?   General: Bowel sounds are normal. There is no distension.  ?   Palpations: Abdomen is soft.  ?   Tenderness: There is no abdominal tenderness. There is no right CVA tenderness, left CVA tenderness, guarding or rebound.  ?Musculoskeletal:     ?   General: No deformity.  ?   Cervical back: Neck supple. No tenderness.  ?   Right lower leg: No edema.  ?   Left lower leg: No edema.  ?Lymphadenopathy:  ?   Cervical: No cervical adenopathy.  ?Skin: ?   General: Skin is  warm and dry.  ?   Capillary Refill: Capillary refill takes less than 2 seconds.  ?Neurological:  ?   General: No focal deficit present.  ?   Mental Status: She is alert and oriented to person, place, and time. Mental status is at baseline.  ?   GCS: GCS eye subscore is 4. GCS verbal subscore is 5. GCS motor subscore is 6.  ?   Cranial Nerves: Cranial nerves 2-12 are intact.  ?   Sensory: Sensation is intact.  ?   Motor: Tremor present. No weakness or pronator drift.  ?   Coordination: Coordination is intact.  ?   Gait: Gait is intact.  ?   Comments: Patient tremulous at baseline in upper and lower extremities.  Neurologic exam is nonfocal.  Strength is symmetric, and sensation is symmetric and normal in lower extremities at time of my evaluation.  Patient is able to ambulate with cane alone without assistance which she states is  her baseline.  ?Psychiatric:     ?   Mood and Affect: Mood normal.  ? ? ?ED Results / Procedures / Treatments   ?Labs ?(all labs ordered are listed, but only abnormal results are displayed) ?Labs Reviewed  ?BASIC METABOLIC PANEL - Abnormal; Notable for the following components:  ?    Result Value  ? Glucose, Bld 106 (*)   ? BUN 5 (*)   ? All other components within normal limits  ?CBC - Abnormal; Notable for the following components:  ? RBC 3.49 (*)   ? MCV 104.3 (*)   ? MCH 37.2 (*)   ? All other components within normal limits  ?HEPATIC FUNCTION PANEL - Abnormal; Notable for the following components:  ? AST 287 (*)   ? ALT 108 (*)   ? Alkaline Phosphatase 171 (*)   ? All other components within normal limits  ?ETHANOL - Abnormal; Notable for the following components:  ? Alcohol, Ethyl (B) 253 (*)   ? All other components within normal limits  ?TSH - Abnormal; Notable for the following components:  ? TSH 5.975 (*)   ? All other components within normal limits  ?CBC WITH DIFFERENTIAL/PLATELET - Abnormal; Notable for the following components:  ? WBC 3.8 (*)   ? RBC 3.47 (*)   ? MCV 105.8 (*)   ? MCH 37.5 (*)   ? nRBC 0.5 (*)   ? Neutro Abs 1.3 (*)   ? All other components within normal limits  ?PREGNANCY, URINE  ?AMMONIA  ?VITAMIN B12  ?RAPID URINE DRUG SCREEN, HOSP PERFORMED  ?VITAMIN B1  ?RPR  ?TROPONIN I (HIGH SENSITIVITY)  ?TROPONIN I (HIGH SENSITIVITY)  ? ? ?EKG ?EKG Interpretation ? ?Date/Time:  Monday August 07 2021 10:45:58 EDT ?Ventricular Rate:  91 ?PR Interval:  160 ?QRS Duration: 84 ?QT Interval:  382 ?QTC Calculation: 469 ?R Axis:   63 ?Text Interpretation: Normal sinus rhythm Normal ECG No previous ECGs available Confirmed by Alvira Monday ([id number]) on 08/07/2021 3:42:09 PM ? ?Radiology ?DG Chest 2 View ? ?Result Date: 08/07/2021 ?CLINICAL DATA:  Falls, left rib pain EXAM: CHEST - 2 VIEW COMPARISON:  None. FINDINGS: The cardiomediastinal silhouette is normal. The lungs clear, with no focal consolidation  or pulmonary edema. There is no pleural effusion or pneumothorax No displaced rib fracture is identified. IMPRESSION: No radiographic evidence of acute cardiopulmonary process. No displaced rib fracture identified. Electronically Signed   By: Lesia Hausen M.D.   On: 08/07/2021 15:02  ? ?CT Head Wo  Contrast ? ?Result Date: 08/07/2021 ?CLINICAL DATA:  Falls, head trauma, vision changes EXAM: CT HEAD WITHOUT CONTRAST TECHNIQUE: Contiguous axial images were obtained from the base of the skull through the vertex without intravenous contrast. RADIATION DOSE REDUCTION: This exam was performed according to the departmental dose-optimization program which includes automated exposure control, adjustment of the mA and/or kV according to patient size and/or use of iterative reconstruction technique. COMPARISON:  None. FINDINGS: Brain: There is no evidence of acute intracranial hemorrhage, extra-axial fluid collection, or acute infarct. Parenchymal volume is normal. The ventricles are normal in size. Gray-white differentiation is preserved. There is no mass lesion.  There is no mass effect or midline shift. Vascular: No hyperdense vessel or unexpected calcification. Skull: Normal. Negative for fracture or focal lesion. Sinuses/Orbits: Imaged paranasal sinuses are clear. The imaged globes and orbits are unremarkable. Other: None. IMPRESSION: Normal head CT. Electronically Signed   By: Lesia Hausen M.D.   On: 08/07/2021 14:57  ? ?MR Brain W and Wo Contrast ? ?Result Date: 08/07/2021 ?CLINICAL DATA:  Multiple sclerosis EXAM: MRI HEAD WITHOUT AND WITH CONTRAST MRI CERVICAL SPINE WITHOUT AND WITH CONTRAST MRI CERVICAL THORACIC WITHOUT AND CONTRAST CONTRAST:  4.68mL GADAVIST GADOBUTROL 1 MMOL/ML IV SOLN TECHNIQUE: Multiplanar, multiecho pulse sequences of the brain and surrounding structures, and cervical and thoracic spine were obtained without and with intravenous contrast. COMPARISON:  None. FINDINGS: MRI HEAD FINDINGS Brain: No acute  infarct, mass effect or extra-axial collection. No acute or chronic hemorrhage. Normal white matter signal, parenchymal volume and CSF spaces. The midline structures are normal. Vascular: Major flow voids are

## 2021-08-07 NOTE — ED Triage Notes (Signed)
Pt arrives pov, slow gait with cane, to triage in wheelchair, with c/o recent mechanical falls, latest being x 4 days. Pt endorses decreased sensation bilaterally of lower extremities, pain bilaterally in feet. Denies blood thinners, denies loc for most recent fall. Reports "changes in vision", inability to see colors.  ?

## 2021-08-07 NOTE — ED Notes (Signed)
Patient transported to MRI 

## 2021-08-07 NOTE — ED Notes (Signed)
ED Provider at bedside. 

## 2021-08-08 LAB — RPR: RPR Ser Ql: NONREACTIVE

## 2021-08-10 LAB — VITAMIN B1: Vitamin B1 (Thiamine): 81.3 nmol/L (ref 66.5–200.0)

## 2021-09-18 ENCOUNTER — Encounter: Payer: Self-pay | Admitting: Diagnostic Neuroimaging

## 2021-09-18 ENCOUNTER — Ambulatory Visit (INDEPENDENT_AMBULATORY_CARE_PROVIDER_SITE_OTHER): Payer: Self-pay | Admitting: Diagnostic Neuroimaging

## 2021-09-18 VITALS — BP 109/77 | HR 89 | Ht 61.0 in | Wt 110.0 lb

## 2021-09-18 DIAGNOSIS — G621 Alcoholic polyneuropathy: Secondary | ICD-10-CM

## 2021-09-18 DIAGNOSIS — F10288 Alcohol dependence with other alcohol-induced disorder: Secondary | ICD-10-CM

## 2021-09-18 NOTE — Patient Instructions (Signed)
?  Alcohol dependence, with neuropathy and ataxia ?- refer to behavioral health for alcohol dependence treatment ?- refer to internal medicine ?- start daily multi-vitamin ?

## 2021-09-18 NOTE — Progress Notes (Signed)
? ?[facility] ? ?PATIENT: Kendra Frost ?DOB: February 10, 1981 ? ?REFERRING CLINICIAN: No ref. provider found ?HISTORY FROM: patient  ?REASON FOR VISIT: new consult  ? ? ?HISTORICAL ? ?CHIEF COMPLAINT:  ?Chief Complaint  ?Patient presents with  ? Numbness  ?  Rm 6 alone ?Pt is well, has been having numbness and tinging in both legs. Started in toes and has moved up her legs recently.  ?Also mention vision changes.   ? ? ?HISTORY OF PRESENT ILLNESS:  ? ?41 year old female here for evaluation of gait mobility difficulties, brain fog, vision fog, decreased color saturation, balance weakness difficulty.  Symptoms started around June 2022.  Patient was living in [place] at that time.  Patient has long history of significant alcohol abuse.  Previously was drinking on daily basis with variety of liquor drinks.  Around June 2022 she cut down and started drinking wine instead of hard liquor.  Now drinking 1 bottle of white wine every other day. ? ?Patient moved to [place] in December 2020 to live with brother.  Started having more problems with gait and balance.  Went to ER in March 2023 for evaluation.  Had MRI of the brain, cervical and thoracic spine to rule out MS.  Had lab testing which showed mild neutropenia and elevated liver function test.  Findings were attributed to chronic alcohol abuse. ? ? ?REVIEW OF SYSTEMS: Full 14 system review of systems performed and negative with exception of: as per HPI. ? ?ALLERGIES: ?Not on File ? ?HOME MEDICATIONS: ?Outpatient Medications Prior to Visit  ?Medication Sig Dispense Refill  ? chlordiazePOXIDE (LIBRIUM) 25 MG capsule 50mg  PO TID x 1D, then 25-50mg  PO BID X 1D, then 25-50mg  PO QD X 1D 15 capsule 0  ? ?No facility-administered medications prior to visit.  ? ? ?PAST MEDICAL HISTORY: ?Past Medical History:  ?Diagnosis Date  ? Thyroid disease   ? ? ?PAST SURGICAL HISTORY: ?History reviewed. No pertinent surgical history. ? ?FAMILY HISTORY: ?History  reviewed. No pertinent family history. ? ?SOCIAL HISTORY: ?Social History  ? ?Socioeconomic History  ? Marital status: Single  ?  Spouse name: Not on file  ? Number of children: Not on file  ? Years of education: Not on file  ? Highest education level: Not on file  ?Occupational History  ? Not on file  ?Tobacco Use  ? Smoking status: Every Day  ?  Types: Cigarettes  ? Smokeless tobacco: Not on file  ?Substance and Sexual Activity  ? Alcohol use: Yes  ?  Comment: occ  ? Drug use: Never  ? Sexual activity: Not on file  ?Other Topics Concern  ? Not on file  ?Social History Narrative  ? Not on file  ? ?Social Determinants of Health  ? ?Financial Resource Strain: Not on file  ?Food Insecurity: Not on file  ?Transportation Needs: Not on file  ?Physical Activity: Not on file  ?Stress: Not on file  ?Social Connections: Not on file  ?Intimate Partner Violence: Not on file  ? ? ? ?PHYSICAL EXAM ? ?GENERAL EXAM/CONSTITUTIONAL: ?Vitals:  ?Vitals:  ? 09/18/21 1050  ?BP: 109/77  ?Pulse: 89  ?Weight: 110 lb (49.9 kg)  ?Height: 5\' 1"  (1.549 m)  ? ?Body mass index is 20.78 kg/m?. ?Wt Readings from Last 3 Encounters:  ?09/18/21 110 lb (49.9 kg)  ?08/07/21 105 lb (47.6 kg)  ? ?Patient is in no distress; well developed, nourished and groomed; neck is supple ? ?CARDIOVASCULAR: ?Examination of carotid arteries is normal; no carotid  bruits ?Regular rate and rhythm, no murmurs ?Examination of peripheral vascular system by observation and palpation is normal ? ?EYES: ?Ophthalmoscopic exam of optic discs and posterior segments is normal; no papilledema or hemorrhages ?No results found. ? ?MUSCULOSKELETAL: ?Gait, strength, tone, movements noted in Neurologic exam below ? ?NEUROLOGIC: ?MENTAL STATUS:  ?   ? View : No data to display.  ?  ?  ?  ? ?awake, alert, oriented to person, place and time ?recent and remote memory intact ?normal attention and concentration ?language fluent, comprehension intact, naming intact ?fund of knowledge  appropriate ? ?CRANIAL NERVE:  ?2nd - no papilledema on fundoscopic exam ?2nd, 3rd, 4th, 6th - pupils equal and reactive to light, visual fields full to confrontation, extraocular muscles intact, no nystagmus; SLOW SACCADIC EYE MOVEMENTS ?5th - facial sensation symmetric ?7th - facial strength symmetric ?8th - hearing intact ?9th - palate elevates symmetrically, uvula midline ?11th - shoulder shrug symmetric ?12th - tongue protrusion midline ? ?MOTOR:  ?normal bulk and tone, full strength in the BUE, BLE ? ?SENSORY:  ?normal and symmetric to light touch, temperature, vibration ? ?COORDINATION:  ?finger-nose-finger, fine finger movements SLOW ? ?REFLEXES:  ?deep tendon reflexes TRACE and symmetric ? ?GAIT/STATION:  ?narrow based gait; USING CANE ? ? ? ? ?DIAGNOSTIC DATA (LABS, IMAGING, TESTING) ?- I reviewed patient records, labs, notes, testing and imaging myself where available. ? ?Lab Results  ?Component Value Date  ? WBC 4.1 08/07/2021  ? WBC 3.8 (L) 08/07/2021  ? HGB 13.0 08/07/2021  ? HGB 13.0 08/07/2021  ? HCT 36.4 08/07/2021  ? HCT 36.7 08/07/2021  ? MCV 104.3 (H) 08/07/2021  ? MCV 105.8 (H) 08/07/2021  ? PLT 171 08/07/2021  ? PLT 176 08/07/2021  ? ?   ?Component Value Date/Time  ? NA 138 08/07/2021 1055  ? K 3.9 08/07/2021 1055  ? CL 103 08/07/2021 1055  ? CO2 24 08/07/2021 1055  ? GLUCOSE 106 (H) 08/07/2021 1055  ? BUN 5 (L) 08/07/2021 1055  ? CREATININE 0.60 08/07/2021 1055  ? CALCIUM 9.0 08/07/2021 1055  ? PROT 7.8 08/07/2021 1427  ? ALBUMIN 3.8 08/07/2021 1427  ? AST 287 (H) 08/07/2021 1427  ? ALT 108 (H) 08/07/2021 1427  ? ALKPHOS 171 (H) 08/07/2021 1427  ? BILITOT 0.6 08/07/2021 1427  ? GFRNONAA >60 08/07/2021 1055  ? ?No results found for: CHOL, HDL, LDLCALC, LDLDIRECT, TRIG, CHOLHDL ?No results found for: HGBA1C ?Lab Results  ?Component Value Date  ? [id number] 910 08/07/2021  ? ?Lab Results  ?Component Value Date  ? TSH 5.975 (H) 08/07/2021  ? ? ?08/07/21 MRI brain  / cervical / thoracic ?1. Normal  MRI of the brain. ?2. No evidence of demyelinating disease within the cervical or ?thoracic spinal cord. ?3. Mild C5-6 and C6-7 degenerative disc disease without spinal canal ?or neural foraminal stenosis. ? ? ?ASSESSMENT AND PLAN ? ?41 y.o. year old female here with: ? ? ?Dx: ? ?1. Alcoholic peripheral neuropathy (HCC)   ?2. 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 ? ? ?PLAN: ? ?Alcohol dependence, with neuropathy and ataxia ?- refer to behavioral health for alcohol dependence treatment ?- refer to internal medicine (follow up hypothyroidism) ?- start daily multi-vitamin ?- consider gabapentin for pain control in future ? ?Orders Placed This Encounter  ?Procedures  ? Ambulatory referral to Internal Medicine  ? Ambulatory referral to Psychiatry  ? ?Return for pending if symptoms worsen or fail to improve, pending test results. ? ? ? ?  Suanne Marker, MD 09/18/2021, 11:56 AM ?Certified in Neurology, Neurophysiology and Neuroimaging ? ?[facility] ?[address], [address] ?[place], [place] [address] ?[phone] ? ?

## 2021-09-27 ENCOUNTER — Ambulatory Visit: Payer: Self-pay | Admitting: Diagnostic Neuroimaging

## 2022-09-19 ENCOUNTER — Encounter (HOSPITAL_COMMUNITY): Payer: Self-pay

## 2022-09-19 ENCOUNTER — Emergency Department (HOSPITAL_COMMUNITY): Payer: BC Managed Care – PPO

## 2022-09-19 ENCOUNTER — Inpatient Hospital Stay (HOSPITAL_COMMUNITY)
Admission: EM | Admit: 2022-09-19 | Discharge: 2022-09-22 | DRG: 897 | Disposition: A | Discharge status: Home / Self Care | Payer: BC Managed Care – PPO | Attending: Internal Medicine | Admitting: Internal Medicine

## 2022-09-19 ENCOUNTER — Other Ambulatory Visit: Payer: Self-pay

## 2022-09-19 DIAGNOSIS — R42 Dizziness and giddiness: Secondary | ICD-10-CM | POA: Diagnosis not present

## 2022-09-19 DIAGNOSIS — Y900 Blood alcohol level of less than 20 mg/100 ml: Secondary | ICD-10-CM | POA: Diagnosis present

## 2022-09-19 DIAGNOSIS — F10239 Alcohol dependence with withdrawal, unspecified: Secondary | ICD-10-CM | POA: Diagnosis not present

## 2022-09-19 DIAGNOSIS — R55 Syncope and collapse: Secondary | ICD-10-CM | POA: Diagnosis not present

## 2022-09-19 DIAGNOSIS — R509 Fever, unspecified: Secondary | ICD-10-CM | POA: Diagnosis present

## 2022-09-19 DIAGNOSIS — H547 Unspecified visual loss: Secondary | ICD-10-CM | POA: Diagnosis present

## 2022-09-19 DIAGNOSIS — H5500 Unspecified nystagmus: Secondary | ICD-10-CM | POA: Diagnosis present

## 2022-09-19 DIAGNOSIS — S0990XA Unspecified injury of head, initial encounter: Secondary | ICD-10-CM | POA: Diagnosis not present

## 2022-09-19 DIAGNOSIS — G621 Alcoholic polyneuropathy: Secondary | ICD-10-CM | POA: Diagnosis present

## 2022-09-19 DIAGNOSIS — F10939 Alcohol use, unspecified with withdrawal, unspecified: Secondary | ICD-10-CM | POA: Diagnosis present

## 2022-09-19 DIAGNOSIS — E039 Hypothyroidism, unspecified: Secondary | ICD-10-CM | POA: Diagnosis present

## 2022-09-19 DIAGNOSIS — D696 Thrombocytopenia, unspecified: Secondary | ICD-10-CM | POA: Diagnosis present

## 2022-09-19 DIAGNOSIS — I10 Essential (primary) hypertension: Secondary | ICD-10-CM | POA: Diagnosis present

## 2022-09-19 DIAGNOSIS — K709 Alcoholic liver disease, unspecified: Secondary | ICD-10-CM | POA: Diagnosis present

## 2022-09-19 DIAGNOSIS — W19XXXA Unspecified fall, initial encounter: Secondary | ICD-10-CM | POA: Diagnosis present

## 2022-09-19 DIAGNOSIS — F17210 Nicotine dependence, cigarettes, uncomplicated: Secondary | ICD-10-CM | POA: Diagnosis not present

## 2022-09-19 DIAGNOSIS — R7401 Elevation of levels of liver transaminase levels: Secondary | ICD-10-CM | POA: Diagnosis not present

## 2022-09-19 DIAGNOSIS — R569 Unspecified convulsions: Principal | ICD-10-CM | POA: Diagnosis present

## 2022-09-19 DIAGNOSIS — R000 Tachycardia, unspecified: Secondary | ICD-10-CM | POA: Diagnosis present

## 2022-09-19 DIAGNOSIS — E876 Hypokalemia: Secondary | ICD-10-CM | POA: Diagnosis not present

## 2022-09-19 DIAGNOSIS — D6959 Other secondary thrombocytopenia: Secondary | ICD-10-CM | POA: Diagnosis present

## 2022-09-19 DIAGNOSIS — F10930 Alcohol use, unspecified with withdrawal, uncomplicated: Secondary | ICD-10-CM | POA: Diagnosis present

## 2022-09-19 DIAGNOSIS — R410 Disorientation, unspecified: Secondary | ICD-10-CM | POA: Diagnosis not present

## 2022-09-19 DIAGNOSIS — F1090 Alcohol use, unspecified, uncomplicated: Secondary | ICD-10-CM | POA: Diagnosis present

## 2022-09-19 HISTORY — DX: Alcohol abuse, uncomplicated: F10.10

## 2022-09-19 LAB — CBC WITH DIFFERENTIAL/PLATELET
Abs Immature Granulocytes: 0.06 10*3/uL (ref 0.00–0.07)
Basophils Absolute: 0 10*3/uL (ref 0.0–0.1)
Basophils Relative: 0 %
Eosinophils Absolute: 0 10*3/uL (ref 0.0–0.5)
Eosinophils Relative: 0 %
HCT: 36.6 % (ref 36.0–46.0)
Hemoglobin: 13 g/dL (ref 12.0–15.0)
Immature Granulocytes: 1 %
Lymphocytes Relative: 13 %
Lymphs Abs: 1.2 10*3/uL (ref 0.7–4.0)
MCH: 34.9 pg — ABNORMAL HIGH (ref 26.0–34.0)
MCHC: 35.5 g/dL (ref 30.0–36.0)
MCV: 98.1 fL (ref 80.0–100.0)
Monocytes Absolute: 0.5 10*3/uL (ref 0.1–1.0)
Monocytes Relative: 6 %
Neutro Abs: 7.5 10*3/uL (ref 1.7–7.7)
Neutrophils Relative %: 80 %
Platelets: 147 10*3/uL — ABNORMAL LOW (ref 150–400)
RBC: 3.73 MIL/uL — ABNORMAL LOW (ref 3.87–5.11)
RDW: 12.1 % (ref 11.5–15.5)
WBC: 9.4 10*3/uL (ref 4.0–10.5)
nRBC: 0 % (ref 0.0–0.2)

## 2022-09-19 LAB — ETHANOL: Alcohol, Ethyl (B): <10 mg/dL (ref <10)

## 2022-09-19 LAB — COMPREHENSIVE METABOLIC PANEL
ALT: 42 U/L (ref 0–44)
AST: 68 U/L — ABNORMAL HIGH (ref 15–41)
Albumin: 3.5 g/dL (ref 3.5–5.0)
Alkaline Phosphatase: 85 U/L (ref 38–126)
Anion gap: 13 (ref 5–15)
BUN: <5 mg/dL — ABNORMAL LOW (ref 6–20)
CO2: 22 mmol/L (ref 22–32)
Calcium: 8.8 mg/dL — ABNORMAL LOW (ref 8.9–10.3)
Chloride: 99 mmol/L (ref 98–111)
Creatinine, Ser: 0.71 mg/dL (ref 0.44–1.00)
GFR, Estimated: >60 mL/min (ref >60)
Glucose, Bld: 95 mg/dL (ref 70–99)
Potassium: 3.3 mmol/L — ABNORMAL LOW (ref 3.5–5.1)
Sodium: 134 mmol/L — ABNORMAL LOW (ref 135–145)
Total Bilirubin: 0.7 mg/dL (ref 0.3–1.2)
Total Protein: 7.1 g/dL (ref 6.5–8.1)

## 2022-09-19 LAB — D-DIMER, QUANTITATIVE: D-Dimer, Quant: 1.92 ug/mL-FEU — ABNORMAL HIGH (ref 0.00–0.50)

## 2022-09-19 LAB — HCG, QUANTITATIVE, PREGNANCY: hCG, Beta Chain, Quant, S: 1 m[IU]/mL (ref <5)

## 2022-09-19 LAB — MAGNESIUM: Magnesium: 1.3 mg/dL — ABNORMAL LOW (ref 1.7–2.4)

## 2022-09-19 LAB — TROPONIN I (HIGH SENSITIVITY): Troponin I (High Sensitivity): 16 ng/L (ref <18)

## 2022-09-19 LAB — FOLATE: Folate: 12.9 ng/mL (ref >5.9)

## 2022-09-19 MED ORDER — ACETAMINOPHEN 500 MG PO TABS
1000.0000 mg | ORAL_TABLET | Freq: Once | ORAL | Status: AC
Start: 2022-09-19 — End: 2022-09-19
  Administered 2022-09-19: 1000 mg via ORAL
  Filled 2022-09-19: qty 2

## 2022-09-19 MED ORDER — LACTATED RINGERS IV BOLUS
1000.0000 mL | Freq: Once | INTRAVENOUS | Status: AC
  Administered 2022-09-19: 1000 mL via INTRAVENOUS

## 2022-09-19 MED ORDER — KETOROLAC TROMETHAMINE 15 MG/ML IJ SOLN
15.0000 mg | Freq: Once | INTRAMUSCULAR | Status: AC
  Administered 2022-09-20: 15 mg via INTRAVENOUS
  Filled 2022-09-19: qty 1

## 2022-09-19 MED ORDER — THIAMINE HCL 100 MG/ML IJ SOLN
100.0000 mg | Freq: Once | INTRAMUSCULAR | Status: AC
  Administered 2022-09-19: 100 mg via INTRAVENOUS
  Filled 2022-09-19: qty 2

## 2022-09-19 MED ORDER — MAGNESIUM SULFATE 2 GM/50ML IV SOLN
2.0000 g | Freq: Once | INTRAVENOUS | Status: AC
  Administered 2022-09-20: 2 g via INTRAVENOUS
  Filled 2022-09-19: qty 50

## 2022-09-19 MED ORDER — LORAZEPAM 1 MG PO TABS: 1.0000 mg | ORAL_TABLET | ORAL | Status: DC | PRN

## 2022-09-19 MED ORDER — DIAZEPAM 5 MG/ML IJ SOLN
5.0000 mg | Freq: Once | INTRAMUSCULAR | Status: AC
  Administered 2022-09-19: 5 mg via INTRAVENOUS
  Filled 2022-09-19: qty 2

## 2022-09-19 MED ORDER — LIDOCAINE 5 % EX PTCH
1.0000 | MEDICATED_PATCH | CUTANEOUS | Status: DC
  Administered 2022-09-20 – 2022-09-21 (×3): 1 via TRANSDERMAL
  Filled 2022-09-19 (×3): qty 1

## 2022-09-19 MED ORDER — POTASSIUM CHLORIDE 20 MEQ PO PACK
40.0000 meq | PACK | Freq: Once | ORAL | Status: AC
  Administered 2022-09-19: 40 meq via ORAL
  Filled 2022-09-19: qty 2

## 2022-09-19 MED ORDER — LORAZEPAM 2 MG/ML IJ SOLN: 1.0000 mg | INTRAMUSCULAR | Status: DC | PRN

## 2022-09-19 NOTE — ED Triage Notes (Signed)
Pt BIB [facility] from work. Pt was working when she felt her vision get blurry and she felt weak. Pt passed out and had a possible seizure according to witnesses. Pt did hit her head. Pt has no seizure hx.   EMS VS BP 178/112.Marland KitchenMarland KitchenMarland Kitchen2nd BP 166/109 HR 150 CBG 168

## 2022-09-19 NOTE — ED Provider Notes (Signed)
[place] EMERGENCY DEPARTMENT AT [facility] Provider Note   CSN: [id number] Arrival date & time: 09/19/22  2047     History  Chief Complaint  Patient presents with   Near Syncope    Syncopal episode and possible seizure activity    Kendra Frost is a 42 y.o. female.  With PMH of alcohol use and thyroid disease who presents after likely seizure today at work.  Patient last drank earlier afternoon today.  She was at work when she felt lightheaded and vision got blurred and collapsed to the ground and noted by witnesses to have shaking.  She did not have any tongue bites or loss of bladder or bowels.  She denied any chest pain or shortness of breath.  No history of PE or DVT.  She did hit her head when falling to the ground.  She has no history of seizures.  She did notes drinking some white wine and white claws daily.  She denies any other drug use.  She would like to try detoxing.  HPI     Home Medications Prior to Admission medications   Medication Sig Start Date End Date Taking? Authorizing Provider  chlordiazePOXIDE (LIBRIUM) 25 MG capsule 50mg  PO TID x 1D, then 25-50mg  PO BID X 1D, then 25-50mg  PO QD X 1D Patient not taking: Reported on 09/19/2022 08/07/21   Sponseller, Eugene Gavia, PA-C      Allergies    Patient has no known allergies.    Review of Systems   Review of Systems  Physical Exam Updated Vital Signs BP (!) 132/98   Pulse 95   Temp 99.1 F (37.3 C) (Oral)   Resp (!) 24   Ht 5\' 1"  (1.549 m)   Wt 50.8 kg   SpO2 100%   BMI 21.16 kg/m  Physical Exam Constitutional: Alert and oriented.  GCS 15, no acute distress, not postictal Eyes: Conjunctivae are normal. ENT      Head: Normocephalic and atraumatic.      Neck: C-spine collar in place.  No C-spine tenderness, step-off or deformity Cardiovascular: S1, S2, tachycardic, regular rhythm, normal and symmetric distal pulses are present in all extremities.Warm and well perfused. Respiratory: Normal  respiratory effort. Breath sounds are normal.  O2 sat 98 on RA.  No chest wall tenderness or deformity. Gastrointestinal: Soft and nontender. Musculoskeletal: Normal range of motion in all extremities.  Mild tremor of upper extremities on extension of upper extremities.      Right lower leg: No tenderness or edema.      Left lower leg: No tenderness or edema. Neurologic: Normal speech and language.  Mild lateral nystagmus.  PERRL.  No facial droop.  Tongue midline with mild tongue fasciculation.  Mild tremor on upper extremity extension.  Full strength 5 out of 5 bilateral upper and lower extremities.  Sensation grossly intact.   Skin: Skin is warm, dry  Psychiatric: Mood and affect are normal. Speech and behavior are normal.  ED Results / Procedures / Treatments   Labs (all labs ordered are listed, but only abnormal results are displayed) Labs Reviewed  COMPREHENSIVE METABOLIC PANEL - Abnormal; Notable for the following components:      Result Value   Sodium 134 (*)    Potassium 3.3 (*)    BUN <5 (*)    Calcium 8.8 (*)    AST 68 (*)    All other components within normal limits  CBC WITH DIFFERENTIAL/PLATELET - Abnormal; Notable for the following components:  RBC 3.73 (*)    MCH 34.9 (*)    Platelets 147 (*)    All other components within normal limits  D-DIMER, QUANTITATIVE - Abnormal; Notable for the following components:   D-Dimer, Quant 1.92 (*)    All other components within normal limits  MAGNESIUM - Abnormal; Notable for the following components:   Magnesium 1.3 (*)    All other components within normal limits  TROPONIN I (HIGH SENSITIVITY) - Abnormal; Notable for the following components:   Troponin I (High Sensitivity) 24 (*)    All other components within normal limits  ETHANOL  HCG, QUANTITATIVE, PREGNANCY  FOLATE  URINALYSIS, ROUTINE W REFLEX MICROSCOPIC  RAPID URINE DRUG SCREEN, HOSP PERFORMED  TROPONIN I (HIGH SENSITIVITY)    EKG EKG  Interpretation  Date/Time:  Wednesday Sep 19 2022 21:01:33 EDT Ventricular Rate:  120 PR Interval:  161 QRS Duration: 84 QT Interval:  328 QTC Calculation: 464 R Axis:   36 Text Interpretation: Sinus tachycardia Abnormal R-wave progression, early transition Borderline T wave abnormalities Confirmed by Vivien Rossetti ([id number]) on 09/19/2022 9:12:10 PM  Radiology CT T-SPINE NO CHARGE  Result Date: 09/20/2022 CLINICAL DATA:  Fall syncope EXAM: CT Thoracic Spine without contrast TECHNIQUE: Multiplanar CT images of the thoracic spine were reconstructed from contemporary CT of the Chest. RADIATION DOSE REDUCTION: This exam was performed according to the departmental dose-optimization program which includes automated exposure control, adjustment of the mA and/or kV according to patient size and/or use of iterative reconstruction technique. CONTRAST:  None or No additional COMPARISON:  MRI 08/07/2021 FINDINGS: Alignment: Normal. Vertebrae: No acute fracture or focal pathologic process. Paraspinal and other soft tissues: Negative. Disc levels: Within normal limits. Small Schmorl's node at T12 superior endplate IMPRESSION: No CT evidence for acute fracture or traumatic malalignment. Electronically Signed   By: Jasmine Pang M.D.   On: 09/20/2022 00:40   CT Angio Chest PE W and/or Wo Contrast  Result Date: 09/20/2022 CLINICAL DATA:  Positive D-dimer syncope EXAM: CT ANGIOGRAPHY CHEST WITH CONTRAST TECHNIQUE: Multidetector CT imaging of the chest was performed using the standard protocol during bolus administration of intravenous contrast. Multiplanar CT image reconstructions and MIPs were obtained to evaluate the vascular anatomy. RADIATION DOSE REDUCTION: This exam was performed according to the departmental dose-optimization program which includes automated exposure control, adjustment of the mA and/or kV according to patient size and/or use of iterative reconstruction technique. CONTRAST:  75mL OMNIPAQUE  IOHEXOL 350 MG/ML SOLN COMPARISON:  Chest x-ray 09/19/2022 FINDINGS: Cardiovascular: Satisfactory opacification of the pulmonary arteries to the segmental level. No evidence of pulmonary embolism. Normal heart size. No pericardial effusion. Nonaneurysmal aorta. No dissection is seen. Mediastinum/Nodes: No enlarged mediastinal, hilar, or axillary lymph nodes. Thyroid gland, trachea, and esophagus demonstrate no significant findings. Lungs/Pleura: Lungs are clear. No pleural effusion or pneumothorax. Upper Abdomen: No acute abnormality. Musculoskeletal: No chest wall abnormality. No acute or significant osseous findings. Review of the MIP images confirms the above findings. IMPRESSION: Negative. No CT evidence for acute pulmonary embolus or aortic dissection. Clear lung fields. Electronically Signed   By: Jasmine Pang M.D.   On: 09/20/2022 00:37   CT Head Wo Contrast  Result Date: 09/19/2022 CLINICAL DATA:  42 year old female with syncope/seizure EXAM: CT HEAD WITHOUT CONTRAST CT CERVICAL SPINE WITHOUT CONTRAST TECHNIQUE: Multidetector CT imaging of the head and cervical spine was performed following the standard protocol without intravenous contrast. Multiplanar CT image reconstructions of the cervical spine were also generated. RADIATION DOSE REDUCTION: This exam  was performed according to the departmental dose-optimization program which includes automated exposure control, adjustment of the mA and/or kV according to patient size and/or use of iterative reconstruction technique. COMPARISON:  CT head 08/07/2021 FINDINGS: CT HEAD FINDINGS Brain: No intracranial hemorrhage, mass effect, or evidence of acute infarct. No hydrocephalus. No extra-axial fluid collection. Vascular: No hyperdense vessel or unexpected calcification. Skull: No fracture or focal lesion. Sinuses/Orbits: No acute finding. Other: None. CT CERVICAL SPINE FINDINGS Alignment: No evidence of traumatic malalignment. Skull base and vertebrae: No  acute fracture. No primary bone lesion or focal pathologic process. Soft tissues and spinal canal: No prevertebral fluid or swelling. No visible canal hematoma. Disc levels: Mild spondylosis at C5-C6 and C6-C7. Posterior disc osteophyte complexes at C5-C6 and C6-C7 cause mild effacement of the ventral thecal sac. No severe spinal canal or neural foraminal narrowing. Upper chest: Negative. Other: None. IMPRESSION: No acute intracranial abnormality. No cervical spine fracture. Electronically Signed   By: Minerva Fester M.D.   On: 09/19/2022 23:07   CT Cervical Spine Wo Contrast  Result Date: 09/19/2022 CLINICAL DATA:  42 year old female with syncope/seizure EXAM: CT HEAD WITHOUT CONTRAST CT CERVICAL SPINE WITHOUT CONTRAST TECHNIQUE: Multidetector CT imaging of the head and cervical spine was performed following the standard protocol without intravenous contrast. Multiplanar CT image reconstructions of the cervical spine were also generated. RADIATION DOSE REDUCTION: This exam was performed according to the departmental dose-optimization program which includes automated exposure control, adjustment of the mA and/or kV according to patient size and/or use of iterative reconstruction technique. COMPARISON:  CT head 08/07/2021 FINDINGS: CT HEAD FINDINGS Brain: No intracranial hemorrhage, mass effect, or evidence of acute infarct. No hydrocephalus. No extra-axial fluid collection. Vascular: No hyperdense vessel or unexpected calcification. Skull: No fracture or focal lesion. Sinuses/Orbits: No acute finding. Other: None. CT CERVICAL SPINE FINDINGS Alignment: No evidence of traumatic malalignment. Skull base and vertebrae: No acute fracture. No primary bone lesion or focal pathologic process. Soft tissues and spinal canal: No prevertebral fluid or swelling. No visible canal hematoma. Disc levels: Mild spondylosis at C5-C6 and C6-C7. Posterior disc osteophyte complexes at C5-C6 and C6-C7 cause mild effacement of the  ventral thecal sac. No severe spinal canal or neural foraminal narrowing. Upper chest: Negative. Other: None. IMPRESSION: No acute intracranial abnormality. No cervical spine fracture. Electronically Signed   By: Minerva Fester M.D.   On: 09/19/2022 23:07   DG Chest Portable 1 View  Result Date: 09/19/2022 CLINICAL DATA:  Syncope/seizure. EXAM: PORTABLE CHEST 1 VIEW COMPARISON:  August 07, 2021 FINDINGS: The heart size and mediastinal contours are within normal limits. Both lungs are clear. The visualized skeletal structures are unremarkable. IMPRESSION: No active disease. Electronically Signed   By: Aram Candela M.D.   On: 09/19/2022 21:35    Procedures .Critical Care  Performed by: Mardene Sayer, MD Authorized by: Mardene Sayer, MD   Critical care provider statement:    Critical care time (minutes):  35   Critical care was necessary to treat or prevent imminent or life-threatening deterioration of the following conditions:  Metabolic crisis   Critical care was time spent personally by me on the following activities:  Development of treatment plan with patient or surrogate, evaluation of patient's response to treatment, examination of patient, ordering and review of laboratory studies, ordering and review of radiographic studies, ordering and performing treatments and interventions, pulse oximetry, re-evaluation of patient's condition, review of old charts and obtaining history from patient or surrogate  Medications Ordered in ED Medications  LORazepam (ATIVAN) tablet 1-4 mg (has no administration in time range)    Or  LORazepam (ATIVAN) injection 1-4 mg (has no administration in time range)  lidocaine (LIDODERM) 5 % 1 patch (1 patch Transdermal Patch Applied 09/20/22 0001)  lactated ringers bolus 1,000 mL (0 mLs Intravenous Stopped 09/20/22 0013)  diazepam (VALIUM) injection 5 mg (5 mg Intravenous Given 09/19/22 2152)  thiamine (VITAMIN B1) injection 100 mg (100 mg  Intravenous Given 09/19/22 2152)  acetaminophen (TYLENOL) tablet 1,000 mg (1,000 mg Oral Given 09/19/22 2146)  magnesium sulfate IVPB 2 g 50 mL (2 g Intravenous New Bag/Given 09/20/22 0036)  potassium chloride (KLOR-CON) packet 40 mEq (40 mEq Oral Given 09/19/22 2359)  ketorolac (TORADOL) 15 MG/ML injection 15 mg (15 mg Intravenous Given 09/20/22 0001)  iohexol (OMNIPAQUE) 350 MG/ML injection 75 mL (75 mLs Intravenous Contrast Given 09/20/22 0030)    ED Course/ Medical Decision Making/ A&P Clinical Course as of 09/20/22 0217  Wed Sep 19, 2022  2336 Reassessed patient, updated her on findings today.  Updated her on plan to obtain CTA PE study.  Discussed with patient and patient's partner at bedside recommending inpatient admission for alcohol withdrawal and new alcohol withdrawal seizure.  They are going to discuss if they would like to stay for this or not.  Patient considering as she would like to stop drinking. [VB]  2336 CT Cervical Spine Wo Contrast [VB]  Thu Sep 20, 2022  0028 Signed out to Dr. Madilyn Hook pending CTA PE study and reassessment of patient.  I recommended to patient she be admitted for alcohol withdrawal especially with new alcohol withdrawal seizure today. [VB]    Clinical Course User Index [VB] Mardene Sayer, MD                             Medical Decision Making Tatasha Mi is a 42 y.o. female.  With PMH of alcohol use and thyroid disease who presents after likely seizure today at work.  Patient last drank earlier afternoon today.   Patient presents with signs and symptoms concerning for alcohol withdrawal especially with known history of heavy use.  She was hypertensive on arrival 135/103 tachycardic to the 120s breathing appropriately satting appropriately on room air.  She is neurologically intact.  Suspect patient likely had alcohol withdrawal seizure today.  Starting on CIWA protocol and gave dose of IV Valium and started on fluids with IV thiamine. Already had  improvement in vitals with first does valium.  Labs reviewed by me notable for changes consistent with chronic alcohol use and abuse.  She has hypokalemia 3.3 and hypomagnesemia 1.3.  Ordered for repletion of both.  Platelets are low 147.  Normal hemoglobin 13.  AST elevated 68 normal ALT.  Creatinine 0.71 within normal limits.  Alcohol undetected here.  EKG reviewed by me sinus tachycardia with nonspecific T wave inversion inferior lead.  She had no acute ischemic changes and troponin was 16, not concern for ACS especially with no chest pain.  However her D-dimer was elevated 1.92 will send for PE study although less likely with no increased work of breathing or hypoxia or signs of DVT on exam.  Signed out to Dr. Madilyn Hook pending CTA PE study and reassessment of patient.  I recommended to patient she be admitted for alcohol withdrawal especially with new alcohol withdrawal seizure today and wanting detox. [VB]  Amount and/or Complexity of Data Reviewed  Labs: ordered. Radiology: ordered. Decision-making details documented in ED Course.  Risk OTC drugs. Prescription drug management.      Final Clinical Impression(s) / ED Diagnoses Final diagnoses:  Seizure (HCC)  Alcohol withdrawal syndrome with complication (HCC)  Hypokalemia  Hypomagnesemia    Rx / DC Orders ED Discharge Orders     None         Mardene Sayer, MD 09/20/22 [phone]

## 2022-09-20 ENCOUNTER — Observation Stay (HOSPITAL_BASED_OUTPATIENT_CLINIC_OR_DEPARTMENT_OTHER): Payer: BC Managed Care – PPO

## 2022-09-20 ENCOUNTER — Emergency Department (HOSPITAL_COMMUNITY): Payer: BC Managed Care – PPO

## 2022-09-20 ENCOUNTER — Observation Stay (HOSPITAL_COMMUNITY): Payer: BC Managed Care – PPO

## 2022-09-20 ENCOUNTER — Encounter (HOSPITAL_COMMUNITY): Payer: Self-pay | Admitting: Student in an Organized Health Care Education/Training Program

## 2022-09-20 DIAGNOSIS — F1090 Alcohol use, unspecified, uncomplicated: Secondary | ICD-10-CM | POA: Diagnosis present

## 2022-09-20 DIAGNOSIS — R55 Syncope and collapse: Secondary | ICD-10-CM

## 2022-09-20 DIAGNOSIS — E039 Hypothyroidism, unspecified: Secondary | ICD-10-CM | POA: Insufficient documentation

## 2022-09-20 DIAGNOSIS — D696 Thrombocytopenia, unspecified: Secondary | ICD-10-CM | POA: Diagnosis present

## 2022-09-20 DIAGNOSIS — R569 Unspecified convulsions: Secondary | ICD-10-CM

## 2022-09-20 DIAGNOSIS — F10939 Alcohol use, unspecified with withdrawal, unspecified: Secondary | ICD-10-CM

## 2022-09-20 DIAGNOSIS — F10930 Alcohol use, unspecified with withdrawal, uncomplicated: Secondary | ICD-10-CM | POA: Diagnosis present

## 2022-09-20 LAB — CBC WITH DIFFERENTIAL/PLATELET
Abs Immature Granulocytes: 0.03 10*3/uL (ref 0.00–0.07)
Basophils Absolute: 0.1 10*3/uL (ref 0.0–0.1)
Basophils Relative: 1 %
Eosinophils Absolute: 0.1 10*3/uL (ref 0.0–0.5)
Eosinophils Relative: 2 %
HCT: 32.6 % — ABNORMAL LOW (ref 36.0–46.0)
Hemoglobin: 11.3 g/dL — ABNORMAL LOW (ref 12.0–15.0)
Immature Granulocytes: 0 %
Lymphocytes Relative: 27 %
Lymphs Abs: 2 10*3/uL (ref 0.7–4.0)
MCH: 34.5 pg — ABNORMAL HIGH (ref 26.0–34.0)
MCHC: 34.7 g/dL (ref 30.0–36.0)
MCV: 99.4 fL (ref 80.0–100.0)
Monocytes Absolute: 0.6 10*3/uL (ref 0.1–1.0)
Monocytes Relative: 9 %
Neutro Abs: 4.5 10*3/uL (ref 1.7–7.7)
Neutrophils Relative %: 61 %
Platelets: 117 10*3/uL — ABNORMAL LOW (ref 150–400)
RBC: 3.28 MIL/uL — ABNORMAL LOW (ref 3.87–5.11)
RDW: 12.3 % (ref 11.5–15.5)
WBC: 7.4 10*3/uL (ref 4.0–10.5)
nRBC: 0 % (ref 0.0–0.2)

## 2022-09-20 LAB — APTT: aPTT: 28 seconds (ref 24–36)

## 2022-09-20 LAB — ECHOCARDIOGRAM COMPLETE
Height: 61 in
S' Lateral: 2.7 cm
Weight: 1792 oz

## 2022-09-20 LAB — MAGNESIUM: Magnesium: 2.3 mg/dL (ref 1.7–2.4)

## 2022-09-20 LAB — HEPATIC FUNCTION PANEL
ALT: 34 U/L (ref 0–44)
AST: 52 U/L — ABNORMAL HIGH (ref 15–41)
Albumin: 2.8 g/dL — ABNORMAL LOW (ref 3.5–5.0)
Alkaline Phosphatase: 72 U/L (ref 38–126)
Bilirubin, Direct: 0.1 mg/dL (ref 0.0–0.2)
Indirect Bilirubin: 0.4 mg/dL (ref 0.3–0.9)
Total Bilirubin: 0.5 mg/dL (ref 0.3–1.2)
Total Protein: 6 g/dL — ABNORMAL LOW (ref 6.5–8.1)

## 2022-09-20 LAB — BASIC METABOLIC PANEL
Anion gap: 11 (ref 5–15)
BUN: 5 mg/dL — ABNORMAL LOW (ref 6–20)
CO2: 17 mmol/L — ABNORMAL LOW (ref 22–32)
Calcium: 8 mg/dL — ABNORMAL LOW (ref 8.9–10.3)
Chloride: 102 mmol/L (ref 98–111)
Creatinine, Ser: 0.59 mg/dL (ref 0.44–1.00)
GFR, Estimated: >60 mL/min (ref >60)
Glucose, Bld: 86 mg/dL (ref 70–99)
Potassium: 3.5 mmol/L (ref 3.5–5.1)
Sodium: 130 mmol/L — ABNORMAL LOW (ref 135–145)

## 2022-09-20 LAB — VITAMIN B12: Vitamin B-12: 249 pg/mL (ref 180–914)

## 2022-09-20 LAB — TROPONIN I (HIGH SENSITIVITY): Troponin I (High Sensitivity): 24 ng/L — ABNORMAL HIGH (ref <18)

## 2022-09-20 LAB — TSH: TSH: 2.005 u[IU]/mL (ref 0.350–4.500)

## 2022-09-20 LAB — PHOSPHORUS: Phosphorus: 3.3 mg/dL (ref 2.5–4.6)

## 2022-09-20 LAB — PROTIME-INR
INR: 1.1 (ref 0.8–1.2)
Prothrombin Time: 14.3 seconds (ref 11.4–15.2)

## 2022-09-20 LAB — HEMOGLOBIN A1C
Hgb A1c MFr Bld: 5.5 % (ref 4.8–5.6)
Mean Plasma Glucose: 111.15 mg/dL

## 2022-09-20 MED ORDER — CHLORDIAZEPOXIDE HCL 5 MG PO CAPS
25.0000 mg | ORAL_CAPSULE | ORAL | Status: DC
  Administered 2022-09-22: 25 mg via ORAL
  Filled 2022-09-20: qty 5

## 2022-09-20 MED ORDER — THIAMINE HCL 100 MG/ML IJ SOLN
100.0000 mg | Freq: Once | INTRAMUSCULAR | Status: DC
Start: 2022-09-20 — End: 2022-09-20

## 2022-09-20 MED ORDER — THIAMINE MONONITRATE 100 MG PO TABS
100.0000 mg | ORAL_TABLET | Freq: Every day | ORAL | Status: DC
  Administered 2022-09-20 – 2022-09-22 (×3): 100 mg via ORAL
  Filled 2022-09-20 (×3): qty 1

## 2022-09-20 MED ORDER — THIAMINE MONONITRATE 100 MG PO TABS
100.0000 mg | ORAL_TABLET | Freq: Every day | ORAL | Status: DC
Start: 2022-09-20 — End: 2022-09-20

## 2022-09-20 MED ORDER — ADULT MULTIVITAMIN W/MINERALS CH
1.0000 | ORAL_TABLET | Freq: Every day | ORAL | Status: DC
Start: 2022-09-20 — End: 2022-09-20

## 2022-09-20 MED ORDER — RIVAROXABAN 10 MG PO TABS
10.0000 mg | ORAL_TABLET | Freq: Every day | ORAL | Status: DC
  Administered 2022-09-20 – 2022-09-22 (×3): 10 mg via ORAL
  Filled 2022-09-20 (×3): qty 1

## 2022-09-20 MED ORDER — ADULT MULTIVITAMIN W/MINERALS CH
1.0000 | ORAL_TABLET | Freq: Every day | ORAL | Status: DC
  Administered 2022-09-20 – 2022-09-22 (×3): 1 via ORAL
  Filled 2022-09-20 (×3): qty 1

## 2022-09-20 MED ORDER — CHLORDIAZEPOXIDE HCL 5 MG PO CAPS: 25.0000 mg | ORAL_CAPSULE | Freq: Every day | ORAL | Status: DC

## 2022-09-20 MED ORDER — ENOXAPARIN SODIUM 30 MG/0.3ML IJ SOSY: 30.0000 mg | PREFILLED_SYRINGE | INTRAMUSCULAR | Status: DC

## 2022-09-20 MED ORDER — CHLORDIAZEPOXIDE HCL 5 MG PO CAPS
25.0000 mg | ORAL_CAPSULE | Freq: Four times a day (QID) | ORAL | Status: AC
  Administered 2022-09-20 (×4): 25 mg via ORAL
  Filled 2022-09-20 (×2): qty 5
  Filled 2022-09-20: qty 1
  Filled 2022-09-20: qty 5

## 2022-09-20 MED ORDER — IOHEXOL 350 MG/ML SOLN
75.0000 mL | Freq: Once | INTRAVENOUS | Status: AC | PRN
  Administered 2022-09-20: 75 mL via INTRAVENOUS

## 2022-09-20 MED ORDER — CHLORDIAZEPOXIDE HCL 5 MG PO CAPS
25.0000 mg | ORAL_CAPSULE | Freq: Three times a day (TID) | ORAL | Status: AC
  Administered 2022-09-21 (×3): 25 mg via ORAL
  Filled 2022-09-20 (×3): qty 5

## 2022-09-20 MED ORDER — FOLIC ACID 1 MG PO TABS
1.0000 mg | ORAL_TABLET | Freq: Every day | ORAL | Status: DC
  Administered 2022-09-20 – 2022-09-22 (×3): 1 mg via ORAL
  Filled 2022-09-20 (×3): qty 1

## 2022-09-20 MED ORDER — CHLORDIAZEPOXIDE HCL 5 MG PO CAPS
25.0000 mg | ORAL_CAPSULE | Freq: Four times a day (QID) | ORAL | Status: DC | PRN
  Administered 2022-09-21 – 2022-09-22 (×2): 25 mg via ORAL
  Filled 2022-09-20: qty 5

## 2022-09-20 MED ORDER — ENOXAPARIN SODIUM 40 MG/0.4ML IJ SOSY: 40.0000 mg | PREFILLED_SYRINGE | INTRAMUSCULAR | Status: DC

## 2022-09-20 NOTE — Plan of Care (Signed)

## 2022-09-20 NOTE — ED Notes (Signed)
Pt gone to CT 

## 2022-09-20 NOTE — Progress Notes (Signed)
Echocardiogram 2D Echocardiogram has been performed.  [name] RDCS 09/20/2022, 12:19 PM

## 2022-09-20 NOTE — ED Notes (Signed)
Patient transported to MRI 

## 2022-09-20 NOTE — Hospital Course (Addendum)
Kendra Frost is a 41 y.o. female with pertinent PMH of alcohol use disorder and alcohol induced peripheral neuropathy who presented after seizure-like episode and was admitted for suspicion of alcohol withdrawal seizure.    # Syncope # Seizure-like episode # Alcohol Use Disorder Presents after syncopal episode at work where she was noted to be shaking by those those witnessed the event.  Reports tapering her alcohol use at home.  Denies prior history of alcohol withdrawal seizures.  MRI brain negative for any acute intracranial abnormalities or mass to explain this episode.  Suspect symptoms are likely secondary to alcohol withdrawal seizure, however, will obtain echo to rule out structural cause of her syncope.  Will also obtain orthostatic vital signs.  CIWA score 0 this morning.  Patient received 1 dose of Valium in the ED.  Will continue with Librium taper at this time.  Patient is interested in abstaining from alcohol.  Will consider naltrexone at discharge. - CIWA w/ Librium taper  - Orthostatic vital signs - Pending echo  MRI brain unrevealing. Echo w/ EF 60-65% w/o findings to explain syncopal episode. Orthostatic vital signs w/ decrease in diastolic BP > 10, however, patient was asymptomatic during testing. Orthostatic hypotension may have contributed to her syncopal episode, however, BP has been stable since admission. Will encourage PO intake. CIWA 1-2 overnight. Suspect syncopal episode was likely secondary to ETOH withdrawal seizure. Will continue Librium taper and offer naltrexone at discharge. - CIWA w/ Librium taper      # Peripheral neuropathy, alcohol induced Deviously seen in the ED/by neurology in 09/2021 for ataxia and vision changes.  Symptoms were suspected to be secondary to alcohol use.  Alcohol cessation was discussed.  It was also discussed that gabapentin could be initiated for her neuropathy likely secondary to alcohol use.  Consider starting gabapentin as  outpatient.  Previously seen in the ED and by neurology for ataxia, vision changes, and peripheral neuropathy suspected to be secondary to ETOH use. Alcohol cessation was recommended.    # Thrombocytopenia Has mild thrombocytopenia since admission.  PT/INR/PTT are WNL.  Albumin is low with mildly elevated AST.  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 - Trend CBC  Mild thrombocytopenia on admission. 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 - Trend CBC   # History of hypothyroidism Reports taking medication for low thyroid function at home. No record on file. TSH on admission WNL.  Will discuss her home regimen prior to discharge. She does not currently have a PCP. Patient will follow-up with our [facility] clinic regarding her hospitalization and home medications.  Reported taking medication at home, however, no record on file. TSH WNL here. Recommend repeat TSH as outpatient to assess need for pharmacotherapy.  -----------------------  5/3: NO headaches, tremors, presyncopal episodes, or seizures overnight.   Last drink was Wednesday 09/19/2022. Patient reports that it has been a long time since she has taken a 2-day break from drinking.   5/4 No dysuria, maybe uriatig a little more frequently than usual No n/v Felt chills last night, but was told she had fever Has residual cough from illness few weeks ago

## 2022-09-20 NOTE — Progress Notes (Signed)
Subjective:   Patient is feeling some generalized weakness this morning. Was able to eat overnight.  Reports that she has never lost consciousness prior to this most recent episode. Reports that she had 1 glass of wine 8 hours prior to her episode of loss of consciousness.  She is interested in quitting drinking and has support at home from her brother.  Objective:  Vital signs in last 24 hours: Vitals:   09/20/22 0130 09/20/22 0600 09/20/22 0630 09/20/22 0645  BP:    (!) 139/103  Pulse:  96 96 (!) 110  Resp:  20 20 20   Temp: 99.1 F (37.3 C) 99.1 F (37.3 C)  98.9 F (37.2 C)  TempSrc: Oral Oral  Oral  SpO2:  99%  100%  Weight:      Height:       Physical Exam: Constitutional: resting in bed, not in acute distress, pleasant Cardio: Regular rate and rhythm. No murmurs, rubs, or gallops. 2+ bilateral radial and dorsalis pedis pulses. Pulm: normal work of breathing on RA, no wheezes or crackles Abdomen: soft, non-tender, normal bowel sounds MSK: moving all extremities  Skin: Warm and dry. Neuro: AxO x3. No focal deficits.   Assessment/Plan:  Principal Problem:   Alcohol withdrawal (HCC) Active Problems:   Thrombocytopenia (HCC)   Alcohol use disorder   Seizure-like activity (HCC)  Kendra Frost is a 42 y.o. female with pertinent PMH of alcohol use disorder and alcohol induced peripheral neuropathy who presented after seizure-like episode and was admitted for suspicion of alcohol withdrawal seizure.    # Syncope # Seizure-like episode # Alcohol Use Disorder Presents after syncopal episode at work where she was noted to be shaking by those those witnessed the event.  Reports tapering her alcohol use at home.  Denies prior history of alcohol withdrawal seizures.  MRI brain negative for any acute intracranial abnormalities or mass to explain this episode.  Suspect symptoms are likely secondary to alcohol withdrawal seizure, however, will obtain echo to rule out structural  cause of her syncope.  Will also obtain orthostatic vital signs.  CIWA score 0 this morning.  Patient received 1 dose of Valium in the ED.  Will continue with Librium taper at this time.  Patient is interested in abstaining from alcohol.  Will consider naltrexone at discharge. - CIWA w/ Librium taper  - Orthostatic vital signs - Pending echo   # Peripheral neuropathy, alcohol induced Deviously seen in the ED/by neurology in 09/2021 for ataxia and vision changes.  Symptoms were suspected to be secondary to alcohol use.  Alcohol cessation was discussed.  It was also discussed that gabapentin could be initiated for her neuropathy likely secondary to alcohol use.  Consider starting gabapentin as outpatient.   # Thrombocytopenia Has mild thrombocytopenia since admission.  PT/INR/PTT are WNL.  Albumin is low with mildly elevated AST.  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 - Trend CBC   # History of hypothyroidism Reports taking medication for low thyroid function at home. No record on file. TSH on admission WNL.  Will discuss her home regimen prior to discharge. She does not currently have a PCP. Patient will follow-up with our [facility] clinic regarding her hospitalization and home medications.   Diet: regular VTE: lovenox IVF: None Code: Full PT/OT recs: none LOS: day 1   Prior to Admission Living Arrangement: at home w/ brother Anticipated Discharge Location: TBD Barriers to Discharge: continued management Dispo: Anticipated discharge in approximately less than 2  day(s).   [name], MD 09/20/2022, 11:12 AM Pager: [phone] After 5pm on weekdays and 1pm on weekends: On Call pager [phone]

## 2022-09-20 NOTE — ED Provider Notes (Signed)
Care assumed at midnight.  Patient with history of alcohol abuse here for evaluation following syncopal episode with possible seizure.  Labs significant for mild hypokalemia, hypomag.  She has been treated with Valium for alcohol withdrawal.  Care assumed pending CTA and repeat troponin.  Repeat troponin is mildly elevated compared to initial.  Patient has some chest soreness, which she attributes to her fall, appears to be musculoskeletal in nature.  CTA is negative for PE or trauma.  Discussed with patient findings of studies.  Given her electrolyte abnormalities, possible seizure in setting of alcohol withdrawal recommend admission for observation.  She is in agreement with plan.  Medicine consulted for admission for ongoing care.   Tilden Fossa, MD 09/20/22 [phone]

## 2022-09-20 NOTE — H&P (Addendum)
Date: 09/20/2022               Patient Name:  Kendra Frost MRN: [id number]  DOB: 13-Oct-1980 Age / Sex: 42 y.o., female   PCP: Pcp, No         Medical Service: Internal Medicine Teaching Service         Attending Physician: Dr. Oswaldo Done, Marquita Palms, *      First Contact: Dr. [name], MD Pager [phone]    Second Contact: Dr. Elza Rafter, DO Pager [phone]         After Hours (After 5p/  First Contact Pager: [phone]  weekends / holidays): Second Contact Pager: [phone]   SUBJECTIVE   Chief Complaint: Syncope  History of Present Illness:  Kendra Frost is a 42 y/o female with history of alcohol use disorder and alcohol induced peripheral neuropathy presenting after a seizure-like episode at work.  She has been cutting back on her alcohol use over the past few weeks and is down from 1-1.5 L of wine a day to half a bottle of wine or 16 ounces of hard seltzer a day over the past 2 weeks.  Over the past 3 days she has had 2 glasses of wine a night and yesterday only had 1 glass of wine.  She was standing up at work when she began having blurred vision.  She eventually became rapidly lightheaded with sudden vision loss and fell to the ground.  People said that she was shaking and was down for about 3 minutes.  Afterwards she felt foggy but there was no urinary incontinence, fecal incontinence, or tongue biting.  She felt like herself about 10 minutes after this episode.  She does have episodes of mild blurry vision about once a week which are usually triggered by looking at her computer screen for too long and improve with eye rest.  She also has intermittent nausea and vomiting that usually associated with her not drinking alcohol.  She has 2-3 bad headaches a month that last about an hour and are associated with sensitivity to noise and light but no aura, known triggers, or consistent location of pain.  No previous syncope, no orthostatic dizziness, no chest pain, no shortness of breath, no  new or changed medications, no change in diet or oral intake other than alcohol, no fevers, dysuria, hematuria, abdominal pain, or other falls.  She does not eat much during the day and usually has maybe an apple in the first half the day but will eat a normal-sized dinner.  She had a upper respiratory illness a week or 2 ago with a lingering cough that is productive for either white or green sputum.  She has stable mild neuropathy with paresthesias in her feet.  She has gone to alcohol rehab several years ago for 30-day inpatient treatment.  Recently she is only on 1-2 days without alcohol with symptoms of nausea and vomiting.  Her last drink was on 09/19/2022 at about 12:30 PM when she had a glass of wine.  ED Course: Presented via EMS and was afebrile, tachycardic, mildly hypertensive, and mildly tachypneic but satting well on room air and without focal neurologic deficits.  Alcohol level was undetectable.  No significant abnormalities on metabolic panel or CBC.  D-dimer is elevated but CTA of the chest ruled out PE.  CTA of the head and cervical spine without acute abnormalities.  [facility] was paged for admission.  Past Medical History Alcohol use disorder Alcohol induced peripheral neuropathy ?  Hypothyroidism-she has prescription for some thyroid medication which she has not taken in the past year, elevated TSH in the chart without T4 level  Meds:  None  Past Surgical History Denies any past surgeries  Social:  Lives with brother in [place] Occupation: [employer] Level of Function: Independent in ADLs and IADLs PCP: Pcp, No, interested in establishing with the [facility] Substances: Prior cocaine and marijuana use over 10 years ago.  Current 1/4 pack/day smoker for the past 25 years.  Significant alcohol use of recently 1-1.5 L of wine a night, 2 weeks ago cut down to 500 mL of wine or 16-20 ounces of hard seltzer at night.  Family History:  Brother with high blood pressure No known heart  attacks, strokes, cancers  Allergies: Allergies as of 09/19/2022   (No Known Allergies)    Review of Systems: A complete ROS was negative except as per HPI.   OBJECTIVE:   Physical Exam: Blood pressure (!) 132/98, pulse 95, temperature 99.1 F (37.3 C), temperature source Oral, resp. rate (!) 24, height 5\' 1"  (1.549 m), weight 50.8 kg, SpO2 100 %.  Constitutional: Well-appearing female laying in bed appears younger than stated age. In no acute distress. HENT: Normocephalic, atraumatic,  Eyes: Sclera non-icteric, PERRL, EOM intact, bilateral horizontal nystagmus Cardio:Regular rate and rhythm. No murmurs, rubs, or gallops. 2+ bilateral radial and dorsalis pedis  pulses. Pulm:Clear to auscultation bilaterally. Normal work of breathing on room air. Abdomen: Soft, non-tender, non-distended, positive bowel sounds.  Possible hepatomegaly NFA:OZHYQMVH for extremity edema. Skin:Warm and dry. Neuro:Alert and oriented x3. No focal deficit noted.  Cranial nerves II through XII intact.  5/5 strength in the bilateral upper and lower extremities.  Bilateral horizontal nystagmus.  Mild neuropathy in bilateral feet with grossly intact sensation, otherwise normal and equal sensation in bilateral upper and lower extremities.  No asterixis. Psych:Pleasant mood and affect.  Labs: CBC    Component Value Date/Time   WBC 9.4 09/19/2022 2156   RBC 3.73 (L) 09/19/2022 2156   HGB 13.0 09/19/2022 2156   HCT 36.6 09/19/2022 2156   PLT 147 (L) 09/19/2022 2156   MCV 98.1 09/19/2022 2156   MCH 34.9 (H) 09/19/2022 2156   MCHC 35.5 09/19/2022 2156   RDW 12.1 09/19/2022 2156   LYMPHSABS 1.2 09/19/2022 2156   MONOABS 0.5 09/19/2022 2156   EOSABS 0.0 09/19/2022 2156   BASOSABS 0.0 09/19/2022 2156     CMP     Component Value Date/Time   NA 134 (L) 09/19/2022 2156   K 3.3 (L) 09/19/2022 2156   CL 99 09/19/2022 2156   CO2 22 09/19/2022 2156   GLUCOSE 95 09/19/2022 2156   BUN <5 (L) 09/19/2022 2156    CREATININE 0.71 09/19/2022 2156   CALCIUM 8.8 (L) 09/19/2022 2156   PROT 7.1 09/19/2022 2156   ALBUMIN 3.5 09/19/2022 2156   AST 68 (H) 09/19/2022 2156   ALT 42 09/19/2022 2156   ALKPHOS 85 09/19/2022 2156   BILITOT 0.7 09/19/2022 2156   GFRNONAA >60 09/19/2022 2156    Imaging: CT T-SPINE NO CHARGE Result Date: 09/20/2022 IMPRESSION: No CT evidence for acute fracture or traumatic malalignment.  Electronically Signed   By: Jasmine Pang M.D.   On: 09/20/2022 00:40   CT Angio Chest PE W and/or Wo Contrast Result Date: 09/20/2022 IMPRESSION: Negative. No CT evidence for acute pulmonary embolus or aortic dissection. Clear lung fields.  Electronically Signed   By: Jasmine Pang M.D.   On: 09/20/2022 00:37  CT Head Wo Contrast/CT Cervical Spine Wo Contrast Result Date: 09/19/2022 IMPRESSION: No acute intracranial abnormality. No cervical spine fracture.  Electronically Signed   By: Minerva Fester M.D.   On: 09/19/2022 23:07   DG Chest Portable 1 View Result Date: 09/19/2022 IMPRESSION: No active disease.  Electronically Signed   By: Aram Candela M.D.   On: 09/19/2022 21:35     EKG: personally reviewed my interpretation is sinus tachycardia. Consistent with prior EKG.  ASSESSMENT & PLAN:   Assessment & Plan by Problem: Principal Problem:   Alcohol withdrawal (HCC) Active Problems:   Syncope   Thrombocytopenia (HCC)   Alcohol use disorder   Kendra Frost is a 42 y.o. female with pertinent PMH of alcohol use disorder and alcohol induced peripheral neuropathy who presented after seizure-like episode and is admitted for alcohol withdrawal and syncope suspicious for alcohol withdrawal seizure.  Alcohol withdrawal No known hospitalizations for alcohol withdrawal or prior alcohol withdrawal seizures but with her seizure-like episode today and recent decrease in alcohol intake we will treat for alcohol withdrawal.  She does have bilateral nystagmus on exam but otherwise her exam is  reassuring.  She did get a dose of Valium in the ED.  We will initiate Librium taper and monitor CIWA scores. - CIWA with Librium taper and Librium as needed  Syncope Seizure-like episode History suggestive of alcohol withdrawal seizure.  Postictal period was very short and mild.  Less concern for cardiac syncope but cannot rule out.  Reassured by no EKG changes consistent with high risk syncope, no signs of heart failure, mild troponin bump at 24 without chest pain or dyspnea.  Some concern for vasovagal or orthostatic although less likely due to no orthostatic dizziness at home and no prior syncope.  No acute intracranial pathology seen on CT of the head without contrast.  Along with treating her alcohol withdrawal we will further evaluate her syncope.  If she is to have another episode we will get a stat spot EEG. - Echocardiogram - Orthostatic vital signs - Monitor for any recurrence  Alcohol use disorder Is interested in quitting and has attempted to self taper but may have gone too quickly.  Discussed naltrexone and patient is interested.  Will plan to initiate this prior to discharge or follow-up since she is interested in establishing with our clinic.  Peripheral neuropathy, alcohol induced Patient seen by neurology in 09/2021 for some vision changes she had been seen in the ED for as well as some ataxia.  All symptoms were thought to be due to her alcohol use and she was recommended to seek cessation methods.  Also considered gabapentin for her neuropathy that the attributed to chronic alcohol use.  Continues to have mild neuropathy and if bothersome could start gabapentin here.  Thrombocytopenia Mild thrombocytopenia with a history of chronic alcohol use, possible hepatomegaly, and intermittent yellow stools are concerning for synthetic liver dysfunction and/or pancreatic dysfunction.  Albumin is low normal with a slightly elevated AST at 68.  ANI index of 1.69, heavily favors alcohol  induced liver disease versus nonalcoholic fatty liver disease.  We will further evaluate synthetic function with coags and if this is persistent outpatient would recommend right upper quadrant ultrasound and hepatitis panel.  History of hypothyroidism Has a prescription for medication she says is for low thyroid function and TSH last year was slightly elevated without any T4 testing.  Will get a TSH here and if abnormal will follow-up with a free T4.  Does  not appear to have overt hypothyroid symptoms outpatient.  Diet: Normal VTE: Enoxaparin IVF: None Code: Full  Dispo: Admit patient to Observation with expected length of stay less than 2 midnights.  Signed: Rocky Morel, DO Internal Medicine Resident PGY-1  09/20/2022, 3:29 AM   Dr. [name], MD Pager [phone]

## 2022-09-21 ENCOUNTER — Other Ambulatory Visit (HOSPITAL_COMMUNITY): Payer: Self-pay

## 2022-09-21 DIAGNOSIS — G621 Alcoholic polyneuropathy: Secondary | ICD-10-CM | POA: Diagnosis present

## 2022-09-21 DIAGNOSIS — H547 Unspecified visual loss: Secondary | ICD-10-CM | POA: Diagnosis present

## 2022-09-21 DIAGNOSIS — F17210 Nicotine dependence, cigarettes, uncomplicated: Secondary | ICD-10-CM | POA: Diagnosis present

## 2022-09-21 DIAGNOSIS — W19XXXA Unspecified fall, initial encounter: Secondary | ICD-10-CM | POA: Diagnosis present

## 2022-09-21 DIAGNOSIS — F10239 Alcohol dependence with withdrawal, unspecified: Secondary | ICD-10-CM | POA: Diagnosis present

## 2022-09-21 DIAGNOSIS — R000 Tachycardia, unspecified: Secondary | ICD-10-CM | POA: Diagnosis present

## 2022-09-21 DIAGNOSIS — R509 Fever, unspecified: Secondary | ICD-10-CM | POA: Diagnosis present

## 2022-09-21 DIAGNOSIS — E876 Hypokalemia: Secondary | ICD-10-CM | POA: Diagnosis present

## 2022-09-21 DIAGNOSIS — I10 Essential (primary) hypertension: Secondary | ICD-10-CM | POA: Diagnosis present

## 2022-09-21 DIAGNOSIS — E039 Hypothyroidism, unspecified: Secondary | ICD-10-CM | POA: Diagnosis present

## 2022-09-21 DIAGNOSIS — Y900 Blood alcohol level of less than 20 mg/100 ml: Secondary | ICD-10-CM | POA: Diagnosis present

## 2022-09-21 DIAGNOSIS — R55 Syncope and collapse: Secondary | ICD-10-CM | POA: Diagnosis present

## 2022-09-21 DIAGNOSIS — R569 Unspecified convulsions: Secondary | ICD-10-CM | POA: Diagnosis present

## 2022-09-21 DIAGNOSIS — R7401 Elevation of levels of liver transaminase levels: Secondary | ICD-10-CM | POA: Diagnosis present

## 2022-09-21 DIAGNOSIS — H5500 Unspecified nystagmus: Secondary | ICD-10-CM | POA: Diagnosis present

## 2022-09-21 DIAGNOSIS — F10930 Alcohol use, unspecified with withdrawal, uncomplicated: Secondary | ICD-10-CM

## 2022-09-21 DIAGNOSIS — D6959 Other secondary thrombocytopenia: Secondary | ICD-10-CM | POA: Diagnosis present

## 2022-09-21 DIAGNOSIS — K709 Alcoholic liver disease, unspecified: Secondary | ICD-10-CM | POA: Diagnosis present

## 2022-09-21 LAB — BASIC METABOLIC PANEL
Anion gap: 10 (ref 5–15)
BUN: <5 mg/dL — ABNORMAL LOW (ref 6–20)
CO2: 22 mmol/L (ref 22–32)
Calcium: 8.7 mg/dL — ABNORMAL LOW (ref 8.9–10.3)
Chloride: 100 mmol/L (ref 98–111)
Creatinine, Ser: 0.62 mg/dL (ref 0.44–1.00)
GFR, Estimated: >60 mL/min (ref >60)
Glucose, Bld: 120 mg/dL — ABNORMAL HIGH (ref 70–99)
Potassium: 3.2 mmol/L — ABNORMAL LOW (ref 3.5–5.1)
Sodium: 132 mmol/L — ABNORMAL LOW (ref 135–145)

## 2022-09-21 LAB — HIV ANTIBODY (ROUTINE TESTING W REFLEX): HIV Screen 4th Generation wRfx: NONREACTIVE

## 2022-09-21 MED ORDER — FOLIC ACID 1 MG PO TABS
1.0000 mg | ORAL_TABLET | Freq: Every day | ORAL | 0 refills | Status: DC
  Filled 2022-09-21: qty 30, 30d supply, fill #0

## 2022-09-21 MED ORDER — POTASSIUM CHLORIDE CRYS ER 20 MEQ PO TBCR
20.0000 meq | EXTENDED_RELEASE_TABLET | Freq: Two times a day (BID) | ORAL | Status: AC
  Administered 2022-09-21 (×2): 20 meq via ORAL
  Filled 2022-09-21 (×2): qty 1

## 2022-09-21 MED ORDER — GUAIFENESIN-DM 100-10 MG/5ML PO SYRP
5.0000 mL | ORAL_SOLUTION | ORAL | Status: DC | PRN
  Administered 2022-09-21 – 2022-09-22 (×5): 5 mL via ORAL
  Filled 2022-09-21 (×5): qty 5

## 2022-09-21 MED ORDER — NALTREXONE HCL 50 MG PO TABS
50.0000 mg | ORAL_TABLET | Freq: Every day | ORAL | 0 refills | Status: DC
  Filled 2022-09-21: qty 30, 30d supply, fill #0

## 2022-09-21 MED ORDER — ACETAMINOPHEN 325 MG PO TABS
650.0000 mg | ORAL_TABLET | Freq: Four times a day (QID) | ORAL | Status: DC | PRN
  Administered 2022-09-21 – 2022-09-22 (×5): 650 mg via ORAL
  Filled 2022-09-21 (×4): qty 2

## 2022-09-21 MED ORDER — ADULT MULTIVITAMIN W/MINERALS CH
1.0000 | ORAL_TABLET | Freq: Every day | ORAL | 0 refills | Status: DC
  Filled 2022-09-21: qty 30, 30d supply, fill #0

## 2022-09-21 MED ORDER — PROPRANOLOL HCL 10 MG PO TABS
10.0000 mg | ORAL_TABLET | Freq: Two times a day (BID) | ORAL | Status: AC
  Administered 2022-09-21: 10 mg via ORAL
  Filled 2022-09-21: qty 1

## 2022-09-21 MED ORDER — CHLORDIAZEPOXIDE HCL 25 MG PO CAPS
ORAL_CAPSULE | ORAL | 0 refills | Status: AC
  Filled 2022-09-21: qty 3, 2d supply, fill #0

## 2022-09-21 MED ORDER — SALINE SPRAY 0.65 % NA SOLN
1.0000 | NASAL | Status: DC | PRN
  Filled 2022-09-21: qty 44

## 2022-09-21 MED ORDER — DM-GUAIFENESIN ER 30-600 MG PO TB12
1.0000 | ORAL_TABLET | Freq: Once | ORAL | Status: AC
  Administered 2022-09-21: 1 via ORAL
  Filled 2022-09-21 (×2): qty 1

## 2022-09-21 MED ORDER — THIAMINE HCL 100 MG PO TABS
100.0000 mg | ORAL_TABLET | Freq: Every day | ORAL | 0 refills | Status: DC
  Filled 2022-09-21: qty 30, 30d supply, fill #0

## 2022-09-21 NOTE — Plan of Care (Signed)

## 2022-09-21 NOTE — Plan of Care (Signed)

## 2022-09-21 NOTE — Progress Notes (Signed)
   Subjective:   Patient is feeling well overall this morning.  Denies anxiety, tremors, headache overnight.  Reports that her last drink was the morning of 5/1.  Reports that she has taken 2-day breaks from drinking a few times over the last few months, however has been drinking regularly for years.  Objective:  Vital signs in last 24 hours: Vitals:   09/20/22 1937 09/20/22 2300 09/21/22 0110 09/21/22 0422  BP: (!) 137/100 123/89 122/88 91/71  Pulse:  (!) 108 (!) 110 (!) 115  Resp: 17 18 20 18   Temp: 100.2 F (37.9 C) (!) 101.7 F (38.7 C) 100.3 F (37.9 C) 99 F (37.2 C)  TempSrc: Oral Oral  Oral  SpO2: 97% 98% 97% 98%  Weight:      Height:       Physical Exam: Constitutional: laying in bed, not in acute distress, pleasant Cardio: Regular rate and rhythm. No murmurs, rubs, or gallops.  Pulm: normal work of breathing on room air, no wheezes or crackles MSK: moving all extremities  Skin: Warm and dry. Neuro: AxO x3. No focal deficits.   Assessment/Plan:  Principal Problem:   Alcohol withdrawal (HCC) Active Problems:   Thrombocytopenia (HCC)   Alcohol use disorder   Seizure-like activity (HCC)  Kendra Frost is a 42 y.o. female with pertinent PMH of alcohol use disorder and alcohol induced peripheral neuropathy who presented after seizure-like episode and was admitted for suspicion of alcohol withdrawal seizure.    # Syncope # Seizure-like episode # Alcohol Use Disorder MRI brain unrevealing. Echo w/ EF 60-65% w/o findings to explain syncopal episode. Orthostatic vital signs w/ decrease in diastolic BP > 10, however, patient was asymptomatic during testing. Orthostatic hypotension may have contributed to her syncopal episode, however, BP has been stable since admission. Will encourage PO intake. CIWA 1-2 overnight. Did not require PRN librium overnight. Suspect syncopal episode was likely secondary to ETOH withdrawal seizure. Will continue Librium taper and offer  naltrexone at discharge. Will observe overnight given higher risk for ETOH withdrawal seizures within 72 hours from last drink.  - CIWA w/ Librium taper   #Hypokalemia Potassium 3.2 this morning. Will replenish. - PO potassium 40 mEq   Diet: regular VTE: lovenox IVF: None Code: Full PT/OT recs: none LOS: day 2     Prior to Admission Living Arrangement: at home w/ brother Anticipated Discharge Location: TBD Barriers to Discharge: continued management Dispo: Anticipated discharge in approximately less than 2 day(s).   [name], MD 09/21/2022, 6:41 AM Pager: [phone] After 5pm on weekdays and 1pm on weekends: On Call pager [phone]

## 2022-09-21 NOTE — TOC Transition Note (Addendum)
Transition of Care [facility]) - CM/SW Discharge Note   Patient Details  Name: Kendra Frost MRN: [id number] Date of Birth: 07-Jul-1980  [facility] CM/SW Contact:  Kermit Balo, RN Phone Number: 09/21/2022, 11:33 AM   Clinical Narrative:    Pt is from home with her brother. She wasn't taking any prescription medications and was driving self as needed.  DME at home: cane Pt is discharging home with self care. PCP appt arranged with [facility]. Information on the AVS.  Pt has transportation home via brother.   Final next level of care: Home/Self Care Barriers to Discharge: Inadequate or no insurance, Barriers Unresolved (comment)   Patient Goals and CMS Choice   Choice offered to / list presented to : Patient  Discharge Placement                         Discharge Plan and Services Additional resources added to the After Visit Summary for                                       Social Determinants of Health (SDOH) Interventions SDOH Screenings   Tobacco Use: High Risk (09/20/2022)     Readmission Risk Interventions     No data to display

## 2022-09-21 NOTE — Progress Notes (Signed)
Pt is in [place] due to tachycardia and temp of 101.7. MD was notified and Tylenol and cough medication ordered. Yellow MEWS protocol will be followed and monitor pt closely  09/20/22 2300  Assess: MEWS Score  Temp (!) 101.7 F (38.7 C)  BP 123/89  MAP (mmHg) 101  Pulse Rate (!) 108  ECG Heart Rate (!) 108  Resp 18  Level of Consciousness Alert  SpO2 98 %  O2 Device Room Air  Assess: MEWS Score  MEWS Temp 2  MEWS Systolic 0  MEWS Pulse 1  MEWS RR 0  MEWS LOC 0  MEWS Score 3  MEWS Score Color Yellow  Assess: if the MEWS score is Yellow or Red  Were vital signs taken at a resting state? Yes  Focused Assessment Change from prior assessment (see assessment flowsheet)  Does the patient meet 2 or more of the SIRS criteria? Yes  Does the patient have a confirmed or suspected source of infection? No  MEWS guidelines implemented  Yes, yellow  Treat  MEWS Interventions Considered administering scheduled or prn medications/treatments as ordered  Take Vital Signs  Increase Vital Sign Frequency  Yellow: Q2hr x1, continue Q4hrs until patient remains green for 12hrs  Escalate  MEWS: Escalate Yellow: Discuss with charge nurse and consider notifying provider and/or RRT  Notify: Charge Nurse/RN  Name of Charge Nurse/RN Notified Bianca  Provider Notification  Provider Name/Title Lucille Passy  Date Provider Notified 09/20/22  Time Provider Notified 2315  Method of Notification Page  Notification Reason Change in status (Yellow MEWS and fever)  Provider response Evaluate remotely;See new orders  Date of Provider Response 09/20/22  Time of Provider Response 2322  Notify: Rapid Response  Name of Rapid Response RN Notified Not needed at this time. Pt is stable and new orders in  Assess: SIRS CRITERIA  SIRS Temperature  1  SIRS Pulse 1  SIRS Respirations  0  SIRS WBC 0  SIRS Score Sum  2

## 2022-09-21 NOTE — TOC Transition Note (Signed)
DISCHARGE MEDS (5) FROM TOC LOCKED UP AT INPATIENT PHARMACY

## 2022-09-22 LAB — BASIC METABOLIC PANEL
Anion gap: 8 (ref 5–15)
BUN: 10 mg/dL (ref 6–20)
CO2: 23 mmol/L (ref 22–32)
Calcium: 8.9 mg/dL (ref 8.9–10.3)
Chloride: 100 mmol/L (ref 98–111)
Creatinine, Ser: 0.63 mg/dL (ref 0.44–1.00)
GFR, Estimated: >60 mL/min (ref >60)
Glucose, Bld: 129 mg/dL — ABNORMAL HIGH (ref 70–99)
Potassium: 3.6 mmol/L (ref 3.5–5.1)
Sodium: 131 mmol/L — ABNORMAL LOW (ref 135–145)

## 2022-09-22 LAB — GLUCOSE, CAPILLARY: Glucose-Capillary: 131 mg/dL — ABNORMAL HIGH (ref 70–99)

## 2022-09-22 MED ORDER — ACETAMINOPHEN 500 MG PO TABS
1000.0000 mg | ORAL_TABLET | Freq: Four times a day (QID) | ORAL | Status: DC | PRN
  Administered 2022-09-22: 1000 mg via ORAL
  Filled 2022-09-22: qty 2

## 2022-09-22 NOTE — Discharge Instructions (Addendum)
You were hospitalized for seizure-like activity.  Hospital Course: We suspect that you may have had a seizure due to alcohol withdrawal.  We gave you medication (Librium) to help with alcohol withdrawal.  Please take this medication tonight and for 1 more day after discharge.  We have also started you on a medication called naltrexone, which decreases cravings for alcohol, please take this medication daily.  We also discharged you with vitamins that are helpful for patients with alcohol use including multivitamin, folic acid, and thiamine.  Please follow-up with our internal medicine clinic as stated below regarding your hospitalization and medication changes.  Medications:  Please start taking: -Librium 25 mg once at night on 5/4 and once daily on 5/5 -Naltrexone 50 mg daily -Thiamine 100 mg daily -Folic acid 1 mg daily -Multivitamin daily  Follow-up: - Please follow up with your primary care provider at the [facility] on 09/28/2022 at 9:15 AM  Phone: [phone] Address: Ground Floor - [facility], [address] [place], [place], [place] [address]

## 2022-09-22 NOTE — Plan of Care (Signed)

## 2022-09-22 NOTE — Discharge Summary (Addendum)
Name: Kendra Frost MRN: [id number] DOB: 06/08/1980 42 y.o. PCP: Pcp, No  Date of Admission: 09/19/2022  8:47 PM Date of Discharge: 09/22/2022 Attending Physician: Reymundo Poll, MD  Discharge Diagnosis: 1. Principal Problem:   Alcohol withdrawal (HCC) Active Problems:   Thrombocytopenia (HCC)   Alcohol use disorder   Seizure-like activity (HCC)   Syncope  Discharge Medications: Allergies as of 09/22/2022   No Known Allergies      Medication List     TAKE these medications    CertaVite/Antioxidants Tabs Take 1 tablet by mouth daily.   chlordiazePOXIDE 25 MG capsule Commonly known as: LIBRIUM Take 1 capsule (25 mg total) by mouth 2 (two) times daily for 1 day, THEN 1 capsule (25 mg total) daily for 1 day. Start taking on: Sep 21, 2022   folic acid 1 MG tablet Commonly known as: FOLVITE Take 1 tablet (1 mg total) by mouth daily.   naltrexone 50 MG tablet Commonly known as: DEPADE Take 1 tablet (50 mg total) by mouth daily.   thiamine 100 MG tablet Commonly known as: VITAMIN B1 Take 1 tablet (100 mg total) by mouth daily.        Disposition and follow-up:   [name] was discharged from [facility] in Good condition.  At the hospital follow up visit please address:  1.    A. Seizure-like activity, Alcohol Use Disorder  - Suspect 2/2 alcohol withdrawal, started on Librium taper to finish on 5/5, discharged on naltrexone, to f/u w/ [facility] clinic  2.  Labs / imaging needed at time of follow-up: CBC  3.  Pending labs/ test needing follow-up: none  Follow-up Appointments:  Follow-up Information     Rana Snare, DO .   Contact information: [address] [place] [address] [phone]         [facility] Follow up on 10/01/2022.   Specialty: Internal Medicine Why: Your appointment is at 11:20 am. please arrive early and bring a picture ID. Contact information: [address] [place]  [place] [address] [phone]        [facility] Follow up.   Why: Use this pharmacy for assistance with your medications Contact information: [address] [place], [place] [address]  Phone: [phone]               - Please follow up with your primary care provider at the [facility] on 09/28/2022 at 9:15 AM  Phone: [phone] Address: Ground Floor - [facility], [address] [place], [place], [place] [address]  Hospital Course by problem list:  Kendra Frost is a 42 y.o. female with pertinent PMH of alcohol use disorder and alcohol induced peripheral neuropathy who presented after seizure-like episode and was admitted for suspected alcohol withdrawal seizure.    # Syncope # Seizure-like episode # Alcohol Use Disorder Presented after syncopal episode at work where she was noted to be shaking by those those witnessed the event.  Reports tapering her alcohol use at home.  Denies prior history of alcohol withdrawal seizures.  MRI brain unrevealing. CT PE study negative. Echo w/ EF 60-65% w/o findings to explain syncopal episode. Orthostatic vital signs w/ decrease in diastolic BP > 10, however, patient was asymptomatic during testing. Suspect syncopal episode/seizure-like activity was likely secondary to ETOH withdrawal seizure. Started on librium taper, will need one day of librium after discharge. Discharged on naltrexone for cravings. To f/u  w/ [facility] clinic.    # Peripheral neuropathy, alcohol induced Previously seen in the ED and by neurology for ataxia, vision changes, and peripheral neuropathy suspected to be secondary to ETOH use. Alcohol cessation was recommended. No complaints of extremity pain or vision changes throughout her hospitalization.    # Thrombocytopenia Mild thrombocytopenia on admission. 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 Recommend  repeat CBC as outpatient.    # History of hypothyroidism Reported taking medication at home, however, no record on file. TSH WNL here. Recommend repeat TSH as outpatient to assess need for pharmacotherapy.   # Fever, resolved # Tachycardia, resolved Was tachycardic w/ fever of 103.1 that resolved w/ tylenol. Tachycardia resolved after 1X dose of propanolol. No leukocytosis, increased oxygen requirements, abnormal lung sounds. Low suspicion for pneumonia. No urinary complaints.  No wounds were noted.  Did not complain of any extremity pain.  Low suspicion for DVT. Patient reported feeling back to baseline and was comfortable going home. Gave strict return precautions if she began having fever, chills, nausea, vomiting, SOB, chest pain, extremity pain, etc. Patient agreed and demonstrated her understanding.   Discharge Exam:   BP (!) 81/54 (BP Location: Right Arm)   Pulse 97   Temp 98.8 F (37.1 C) (Oral)   Resp 16   Ht 5\' 1"  (1.549 m)   Wt 50.8 kg   SpO2 97%   BMI 21.16 kg/m  Constitutional: laying in bed, not in acute distress, pleasant Cardio: Regular rate and rhythm. No murmurs, rubs, or gallops.  Pulm: normal work of breathing on room air, no wheezes or crackles MSK: moving all extremities  Skin: Warm and dry. Neuro: AxO x3. No focal deficits.   Pertinent Labs, Studies, and Procedures:     Latest Ref Rng & Units 09/20/2022    4:14 AM 09/19/2022    9:56 PM 08/07/2021   10:55 AM  CBC  WBC 4.0 - 10.5 K/uL 7.4  9.4  4.1    3.8   Hemoglobin 12.0 - 15.0 g/dL 16.1  09.6  04.5    40.9   Hematocrit 36.0 - 46.0 % 32.6  36.6  36.4    36.7   Platelets 150 - 400 K/uL 117  147  171    176        Latest Ref Rng & Units 09/22/2022    3:49 AM 09/21/2022    6:59 AM 09/20/2022    4:14 AM  BMP  Glucose 70 - 99 mg/dL 811  914  86   BUN 6 - 20 mg/dL 10  <5  5   Creatinine 0.44 - 1.00 mg/dL 7.82  9.56  2.13   Sodium 135 - 145 mmol/L 131  132  130   Potassium 3.5 - 5.1 mmol/L 3.6  3.2  3.5    Chloride 98 - 111 mmol/L 100  100  102   CO2 22 - 32 mmol/L 23  22  17    Calcium 8.9 - 10.3 mg/dL 8.9  8.7  8.0     DG Chest Portable 1 View  IMPRESSION: No active disease.   On: 09/19/2022 21:35  CT Head Wo Contrast  IMPRESSION: No acute intracranial abnormality.   No cervical spine fracture.   On: 09/19/2022 23:07  CT Cervical Spine Wo Contrast  IMPRESSION: No acute intracranial abnormality.   No cervical spine fracture.   On: 09/19/2022 23:07  CT Angio Chest PE W and/or Wo Contrast  IMPRESSION: Negative. No CT evidence for acute pulmonary embolus or aortic dissection. Clear lung fields.   On: 09/20/2022 00:37  CT T-SPINE NO CHARGE  IMPRESSION: No CT evidence for acute fracture or traumatic malalignment.   On: 09/20/2022 00:40  MR BRAIN WO CONTRAST  IMPRESSION: 1. No acute intracranial abnormality or mass. 2. Mild cerebral atrophy with disproportionate cerebellar atrophy, compatible with history of chronic alcohol use disorder.   On: 09/20/2022 10:09   Discharge Instructions: Discharge Instructions     Call MD for:  difficulty breathing, headache or visual disturbances   Complete by: As directed    Call MD for:  extreme fatigue   Complete by: As directed    Call MD for:  persistant dizziness or light-headedness   Complete by: As directed    Call MD for:  persistant nausea and vomiting   Complete by: As directed    Call MD for:  redness, tenderness, or signs of infection (pain, swelling, redness, odor or green/yellow discharge around incision site)   Complete by: As directed    Call MD for:  temperature >100.4   Complete by: As directed    Diet - low sodium heart healthy   Complete by: As directed    Increase activity slowly   Complete by: As directed       You were hospitalized for seizure-like activity.  Hospital Course: We suspect that you may have had a seizure due to alcohol withdrawal.  We gave you medication (Librium) to help with alcohol  withdrawal.  Please take this medication tonight and for 1 more day after discharge.  We have also started you on a medication called naltrexone, which decreases cravings for alcohol, please take this medication daily.  We also discharged you with vitamins that are helpful for patients with alcohol use including multivitamin, folic acid, and thiamine.  Please follow-up with our internal medicine clinic as stated below regarding your hospitalization and medication changes.  Medications:  Please start taking: -Librium 25 mg once at night on 5/4 and once daily on 5/5 -Naltrexone 50 mg daily -Thiamine 100 mg daily -Folic acid 1 mg daily -Multivitamin daily  Follow-up: - Please follow up with your primary care provider at the [facility] on 09/28/2022 at 9:15 AM  Phone: [phone] Address: Ground Floor - [facility], [address] [place], [place], [place] [address]  Signed: [name], MD 09/22/2022, 1:01 PM   Pager: [phone]

## 2022-09-24 ENCOUNTER — Telehealth: Payer: Self-pay

## 2022-09-24 LAB — METHYLMALONIC ACID, SERUM: Methylmalonic Acid, Quantitative: 192 nmol/L (ref 0–378)

## 2022-09-24 NOTE — Telephone Encounter (Signed)
Forwarding to residents

## 2022-09-24 NOTE — Telephone Encounter (Signed)
Patient called she stated her job is requesting a work excuse note from her ED visit.

## 2022-09-24 NOTE — Telephone Encounter (Signed)
Sent patient work note via [employer].

## 2022-09-28 ENCOUNTER — Ambulatory Visit: Payer: BC Managed Care – PPO | Admitting: Student

## 2022-09-28 ENCOUNTER — Encounter: Payer: Self-pay | Admitting: Student

## 2022-09-28 VITALS — BP 95/72 | HR 91 | Temp 98.4°F | Ht 61.0 in | Wt 115.8 lb

## 2022-09-28 DIAGNOSIS — D696 Thrombocytopenia, unspecified: Secondary | ICD-10-CM

## 2022-09-28 DIAGNOSIS — F1090 Alcohol use, unspecified, uncomplicated: Secondary | ICD-10-CM

## 2022-09-28 LAB — PROTIME-INR
INR: 1.1 (ref 0.8–1.2)
Prothrombin Time: 14.1 seconds (ref 11.4–15.2)

## 2022-09-28 MED ORDER — NALTREXONE HCL 50 MG PO TABS: 50.0000 mg | ORAL_TABLET | Freq: Every day | ORAL | 2 refills | Status: DC

## 2022-09-28 NOTE — Assessment & Plan Note (Addendum)
Patient has history of alcohol use disorder, and is still currently drinking.  Patient was recently hospitalized from 09/20/2022 to 09/22/2022 for alcohol withdrawal seizures.  Patient herself was trying to self titrate off alcohol, and developed a alcohol withdrawal seizure.  In the hospital, patient was started on a Librium taper, which patient finished outpatient.  Patient was also discharged on naltrexone.  Today, patient reports that she has had recent stressors as her anniversary of her ex marriage just passed, and also found out that her boyfriend had another girlfriend.  She states that she started drinking again.  She is currently drinking 3 glasses of wine a day and 2 shots of vodka a day which is new for her as she has never tried liquor before.  She states that she has not been going to work, but at times to go to work after having a drink or 2.  She states that she did come to the clinic today after drinking a glass of wine.  She denies taking her medications.  She denies any tremors or hallucinations.  She does report having sweats at times.  On exam, patient is not tachycardic, and is alert and oriented x 3.  Patient is able to ambulate without concern.  Patient does inform me that she would like to drinking.  She states she would like to go cold [place].  Upon this, I did ask her when wants to stop drinking, and she states she would like to stop drinking starting tomorrow.  She states she will finish drinking today and tomorrow go cold [place].  Upon this, I offered her admission given her recent seizure, and at this time she stated that she has family plans this weekend as it is Mother's Day, and does not want to be admitted and prefers to go home on a Librium taper.  After further counseling, and recommending admission, patient states she rather wait to be admitted and continue drinking for now.  Even though this is against medical recommendation, she understands that if she keeps drinking she will  not go into withdrawals, and would rather get admitted later for detox.  I do recommend patient to continue naltrexone, even if she is drinking, given that this can help reduce her cravings which she is agreeable to.  I do recommend patient to stop drinking, and she states that she will keep drinking until Sunday to avoid withdrawals, and then plan to admit her to the hospital on 10/01/2022 for detox.  Plan: -Patient counseled on sustaining from alcohol use -Patient agreed to go to AA meetings [place] street at the [place] -Patient encouraged to continue taking naltrexone -Patient plans to be admitted on Monday for alcohol detox -Did encourage patient to go to emergency room patient gets sick, developed seizure or starts having hallucinations -CMP hemolyzed, will plan to get CMP on admission on Monday 10-01-2022 -PT INR pending

## 2022-09-28 NOTE — Progress Notes (Signed)
CC:   HPI:  Kendra Frost is a 42 y.o. female with a past medical history of alcohol use and hypothyroidism who presents for hospital follow-up.  Patient was recently hospitalized from 09/20/2022 to 09/22/2022 for alcohol withdrawal seizures. Please see assessment and plan for full HPI.    Past Medical History:  Diagnosis Date   Alcohol abuse    Thyroid disease      Current Outpatient Medications:    folic acid (FOLVITE) 1 MG tablet, Take 1 tablet (1 mg total) by mouth daily., Disp: 30 tablet, Rfl: 0   Multiple Vitamin (MULTIVITAMIN WITH MINERALS) TABS tablet, Take 1 tablet by mouth daily., Disp: 30 tablet, Rfl: 0   naltrexone (DEPADE) 50 MG tablet, Take 1 tablet (50 mg total) by mouth daily., Disp: 30 tablet, Rfl: 2   thiamine (VITAMIN B1) 100 MG tablet, Take 1 tablet (100 mg total) by mouth daily., Disp: 30 tablet, Rfl: 0  Review of Systems:   Negative except for what is stated in HPI  Physical Exam:  Vitals:   09/28/22 1010  BP: 95/72  Pulse: 91  Temp: 98.4 F (36.9 C)  TempSrc: Oral  SpO2: 96%  Weight: 115 lb 12.8 oz (52.5 kg)  Height: 5\' 1"  (1.549 m)    General: Patient is sitting comfortably in the room  Head: Normocephalic, atraumatic  Cardio: Regular rate and rhythm, no murmurs, rubs or gallops. 2+ pulses to bilateral upper and lower extremities  Pulmonary: Clear to ausculation bilaterally with no rales, rhonchi, and crackles  Neuro: Alert and oriented x 3, no tremors appreciated   Assessment & Plan:   Thrombocytopenia (HCC) Patient has a history of thrombocytopenia.  Patient was recently hospitalized and on CBC patient had platelet count of 117.  Patient denies any bleeding, bruising, or any concerns at this time.  On exam, I do not appreciate any petechiae or any bruises.  Unclear etiology at this time.  This could be related to underlying alcohol use.  Could have been reactive in the setting of acute seizures for what patient was initially admitted  for.  Plan: -Repeat CBC pending  Alcohol use disorder Patient has history of alcohol use disorder, and is still currently drinking.  Patient was recently hospitalized from 09/20/2022 to 09/22/2022 for alcohol withdrawal seizures.  Patient herself was trying to self titrate off alcohol, and developed a alcohol withdrawal seizure.  In the hospital, patient was started on a Librium taper, which patient finished outpatient.  Patient was also discharged on naltrexone.  Today, patient reports that she has had recent stressors as her anniversary of her ex marriage just passed, and also found out that her boyfriend had another girlfriend.  She states that she started drinking again.  She is currently drinking 3 glasses of wine a day and 2 shots of vodka a day which is new for her as she has never tried liquor before.  She states that she has not been going to work, but at times to go to work after having a drink or 2.  She states that she did come to the clinic today after drinking a glass of wine.  She denies taking her medications.  She denies any tremors or hallucinations.  She does report having sweats at times.  On exam, patient is not tachycardic, and is alert and oriented x 3.  Patient is able to ambulate without concern.  Patient does inform me that she would like to drinking.  She states she would like to  go cold [place].  Upon this, I did ask her when wants to stop drinking, and she states she would like to stop drinking starting tomorrow.  She states she will finish drinking today and tomorrow go cold [place].  Upon this, I offered her admission given her recent seizure, and at this time she stated that she has family plans this weekend as it is Mother's Day, and does not want to be admitted and prefers to go home on a Librium taper.  After further counseling, and recommending admission, patient states she rather wait to be admitted and continue drinking for now.  Even though this is against medical  recommendation, she understands that if she keeps drinking she will not go into withdrawals, and would rather get admitted later for detox.  I do recommend patient to continue naltrexone, even if she is drinking, given that this can help reduce her cravings which she is agreeable to.  I do recommend patient to stop drinking, and she states that she will keep drinking until Sunday to avoid withdrawals, and then plan to admit her to the hospital on 10/01/2022 for detox.  Plan: -Patient counseled on sustaining from alcohol use -Patient agreed to go to AA meetings [place] the street at the church -Patient encouraged to continue taking naltrexone -Patient plans to be admitted on Monday for alcohol detox -Did encourage patient to go to emergency room patient gets sick, developed seizure or starts having hallucinations -CMP hemolyzed, will plan to get CMP on admission on Monday 10-01-2022 -PT INR pending  Patient seen with Dr. Oren Bracket, DO PGY-1 Internal Medicine Resident  Pager: [phone]

## 2022-09-28 NOTE — Patient Instructions (Addendum)
Kendra, Frost you for allowing me to take part in your care today.  Here are your instructions.  1.  Regarding alcohol use, I understand that you started drinking again, and I understand you have had many stressors over the past week.  I do recommend to ultimately cutting down on your alcohol intake.  I will refill your naltrexone today.  You can take this while drinking, and this will reduce your cravings.  Once you are ready for full detox, we can admit you.  2.  If you start feeling ill, do have a seizure, please go to the emergency room.  3.  I am checking some lab work today.  I will call you with the results.  Thank you, Dr. Allena Katz  If you have any other questions please contact the internal medicine clinic at [phone] If you have any questions or concerns, call our clinic at [phone] or after hours call [phone] and ask for the internal medicine resident on call.

## 2022-09-28 NOTE — Assessment & Plan Note (Signed)
Patient has a history of thrombocytopenia.  Patient was recently hospitalized and on CBC patient had platelet count of 117.  Patient denies any bleeding, bruising, or any concerns at this time.  On exam, I do not appreciate any petechiae or any bruises.  Unclear etiology at this time.  This could be related to underlying alcohol use.  Could have been reactive in the setting of acute seizures for what patient was initially admitted for.  Plan: -Repeat CBC pending

## 2022-09-29 LAB — CBC
Hematocrit: 37.9 % (ref 34.0–46.6)
Hemoglobin: 12.5 g/dL (ref 11.1–15.9)
MCH: 32.9 pg (ref 26.6–33.0)
MCHC: 33 g/dL (ref 31.5–35.7)
MCV: 100 fL — ABNORMAL HIGH (ref 79–97)
Platelets: 426 10*3/uL (ref 150–450)
RBC: 3.8 x10E6/uL (ref 3.77–5.28)
RDW: 12.5 % (ref 11.7–15.4)
WBC: 6.4 10*3/uL (ref 3.4–10.8)

## 2022-10-01 ENCOUNTER — Ambulatory Visit (INDEPENDENT_AMBULATORY_CARE_PROVIDER_SITE_OTHER): Payer: BC Managed Care – PPO | Admitting: Student

## 2022-10-01 ENCOUNTER — Encounter (INDEPENDENT_AMBULATORY_CARE_PROVIDER_SITE_OTHER): Payer: BC Managed Care – PPO | Admitting: Nurse Practitioner

## 2022-10-01 ENCOUNTER — Encounter (HOSPITAL_COMMUNITY): Payer: Self-pay | Admitting: Internal Medicine

## 2022-10-01 ENCOUNTER — Other Ambulatory Visit: Payer: Self-pay

## 2022-10-01 ENCOUNTER — Observation Stay (HOSPITAL_COMMUNITY)
Admission: AD | Admit: 2022-10-01 | Discharge: 2022-10-03 | Disposition: A | Discharge status: Home / Self Care | Payer: BC Managed Care – PPO | Source: Ambulatory Visit | Attending: Internal Medicine | Admitting: Internal Medicine

## 2022-10-01 ENCOUNTER — Encounter (HOSPITAL_COMMUNITY): Payer: Self-pay

## 2022-10-01 ENCOUNTER — Encounter: Payer: Self-pay | Admitting: Student

## 2022-10-01 DIAGNOSIS — F10939 Alcohol use, unspecified with withdrawal, unspecified: Secondary | ICD-10-CM | POA: Diagnosis not present

## 2022-10-01 DIAGNOSIS — D696 Thrombocytopenia, unspecified: Secondary | ICD-10-CM

## 2022-10-01 DIAGNOSIS — K921 Melena: Secondary | ICD-10-CM | POA: Diagnosis not present

## 2022-10-01 DIAGNOSIS — F10930 Alcohol use, unspecified with withdrawal, uncomplicated: Secondary | ICD-10-CM | POA: Diagnosis not present

## 2022-10-01 DIAGNOSIS — F1090 Alcohol use, unspecified, uncomplicated: Secondary | ICD-10-CM | POA: Diagnosis present

## 2022-10-01 DIAGNOSIS — E039 Hypothyroidism, unspecified: Secondary | ICD-10-CM | POA: Insufficient documentation

## 2022-10-01 DIAGNOSIS — N939 Abnormal uterine and vaginal bleeding, unspecified: Secondary | ICD-10-CM | POA: Insufficient documentation

## 2022-10-01 DIAGNOSIS — F17210 Nicotine dependence, cigarettes, uncomplicated: Secondary | ICD-10-CM | POA: Insufficient documentation

## 2022-10-01 LAB — CBC
HCT: 37.2 % (ref 36.0–46.0)
Hemoglobin: 12.4 g/dL (ref 12.0–15.0)
MCH: 33 pg (ref 26.0–34.0)
MCHC: 33.3 g/dL (ref 30.0–36.0)
MCV: 98.9 fL (ref 80.0–100.0)
Platelets: 405 10*3/uL — ABNORMAL HIGH (ref 150–400)
RBC: 3.76 MIL/uL — ABNORMAL LOW (ref 3.87–5.11)
RDW: 12.8 % (ref 11.5–15.5)
WBC: 6.7 10*3/uL (ref 4.0–10.5)
nRBC: 0 % (ref 0.0–0.2)

## 2022-10-01 LAB — COMPREHENSIVE METABOLIC PANEL
ALT: 24 U/L (ref 0–44)
AST: 47 U/L — ABNORMAL HIGH (ref 15–41)
Albumin: 3.2 g/dL — ABNORMAL LOW (ref 3.5–5.0)
Alkaline Phosphatase: 78 U/L (ref 38–126)
Anion gap: 10 (ref 5–15)
BUN: <5 mg/dL — ABNORMAL LOW (ref 6–20)
CO2: 25 mmol/L (ref 22–32)
Calcium: 8.8 mg/dL — ABNORMAL LOW (ref 8.9–10.3)
Chloride: 105 mmol/L (ref 98–111)
Creatinine, Ser: 0.51 mg/dL (ref 0.44–1.00)
GFR, Estimated: >60 mL/min (ref >60)
Glucose, Bld: 90 mg/dL (ref 70–99)
Potassium: 3.8 mmol/L (ref 3.5–5.1)
Sodium: 140 mmol/L (ref 135–145)
Total Bilirubin: 0.3 mg/dL (ref 0.3–1.2)
Total Protein: 7.2 g/dL (ref 6.5–8.1)

## 2022-10-01 MED ORDER — KETOROLAC TROMETHAMINE 15 MG/ML IJ SOLN
15.0000 mg | Freq: Once | INTRAMUSCULAR | Status: AC
  Administered 2022-10-01: 15 mg via INTRAVENOUS
  Filled 2022-10-01: qty 1

## 2022-10-01 MED ORDER — ENOXAPARIN SODIUM 40 MG/0.4ML IJ SOSY
40.0000 mg | PREFILLED_SYRINGE | INTRAMUSCULAR | Status: DC
  Administered 2022-10-01: 40 mg via SUBCUTANEOUS
  Filled 2022-10-01: qty 0.4

## 2022-10-01 MED ORDER — ACETAMINOPHEN 500 MG PO TABS
1000.0000 mg | ORAL_TABLET | Freq: Once | ORAL | Status: AC
  Administered 2022-10-01: 1000 mg via ORAL
  Filled 2022-10-01: qty 2

## 2022-10-01 MED ORDER — ACETAMINOPHEN 325 MG PO TABS: 650.0000 mg | ORAL_TABLET | Freq: Four times a day (QID) | ORAL | Status: DC | PRN

## 2022-10-01 NOTE — Progress Notes (Signed)
Internal Medicine Clinic Attending  I saw and evaluated the patient.  I personally confirmed the key portions of the history and exam documented by Dr. Allena Katz and I reviewed pertinent patient test results.  The assessment, diagnosis, and plan were formulated together and I agree with the documentation in the resident's note.   Unfortunately patient has relapsed with her drinking. She would like to stop drinking again but is high risk for outpatient detox due to recent withdrawal seizure. She has plans for Mother's day on Sunday and would like to stop drinking on Monday. We have advised she be admitted for inpatient monitoring and treatment given her recent seizure, and patient is agreeable. We have scheduled her for a telehealth appointment Monday morning and will hopefully direct admit from home at that time when she is ready.

## 2022-10-01 NOTE — Progress Notes (Signed)
Internal Medicine Clinic Attending  Case discussed with Dr. Allena Katz  At the time of the visit.  We reviewed the resident's history and exam and pertinent patient test results.  I agree with the assessment, diagnosis, and plan of care documented in the resident's note.   Please see the OV note and my attestation from 5/10 for further details.

## 2022-10-01 NOTE — Progress Notes (Signed)
Paged White MD of patient's BP.

## 2022-10-01 NOTE — H&P (Signed)
Date: 10/01/2022         Patient Name:  Kendra Frost MRN: [id number]  DOB: 05-23-1980 Age / Sex: 42 y.o., female   PCP: Ivonne Andrew, NP         Medical Service: Internal Medicine Teaching Service         Attending Physician: Dr. Gust Rung, DO    First Contact: Dr. Benito Mccreedy Pager: [phone]  Second Contact: Dr. Marijo Conception Pager: [phone]       After Hours (After 5p/  First Contact Pager: [phone]  weekends / holidays): Second Contact Pager: [phone]   Chief Concern: Monitored alcohol withdrawal  History of Present Illness: This person is a 42 year old with alcohol use disorder who was admitted from home for medically monitored alcohol withdrawal.  She was recently hospitalized for seizure-like activity attributed to alcohol withdrawal syndrome.  She was treated with a Librium taper and discharged on naltrexone, which she has not started.  Since discharge her drinking has decreased significantly.  She reports that in the last 24 hours she has had 2-3 drinks.  Her last drink was a few hours prior to admission.  Right now she is not having signs or symptoms of alcohol withdrawal.  Review of Systems  Gastrointestinal:  Positive for melena. Negative for abdominal pain, blood in stool, nausea and vomiting.  Neurological:  Negative for tremors.  Endo/Heme/Allergies:  Does not bruise/bleed easily.   Allergies: No Known Allergies  Past Medical History: Alcohol use disorder, hypothyroidism, thrombocytopenia  Medications: Thiamine, folate, multivitamins, naltrexone  Social History:  Lives at home in [place] with her brother who is an excellent source support.  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  She smokes 3 to 5 cigarettes/day.  She has been drinking for 30 years.  She does not currently use other drugs.  She has no history of IV drug use.  Physical Exam: Blood pressure 92/71, pulse 91, resp. rate 16, SpO2 96 %.  Overall  well-appearing Heart rate is normal, rhythm is regular, radial pulses are strong, no lower extremity edema Breathing is regular and unlabored on room air, lungs are clear to auscultation Abdomen soft and nontender, no palpable hepatomegaly or splenomegaly Skin is warm and dry without jaundice Alert and oriented, no tremor Pleasant, mood and affect concordant  Labs: CMP pending WBC within normal limits Hemoglobin within normal limits Platelets 405  Assessment & Plan:  Kendra Frost is a 42 y.o. with history of alcohol use disorder admitted for medically monitored alcohol withdrawal.  Principal Problem:   Alcohol withdrawal (HCC) Active Problems:   Alcohol use disorder  Alcohol use disorder Alcohol withdrawal This person was admitted from home out of concern for early or impending alcohol withdrawal.  Reassuringly she does not have any signs or symptoms of alcohol withdrawal now except for headache.  She is comfortable and relaxed.  She has had a few drinks in the last 24 hours, last drink a few hours ago so it is early.  Her alcohol intake is actually been relatively low the past 24 hours.  For now we will monitor her on the CIWA protocol and will treat symptoms as they arise but my current impression is that she is low risk for having severe withdrawal syndrome.  I think the more important intervention this hospitalization would be to counsel her about abstinence when she leaves and encouraged her to start naltrexone for cravings. - CIWA without Ativan -  Tylenol for headache  Melena Reports history of intermittent dark/black stool.  No anemia which is reassuring.  No GERD or heartburn.  No history of EGD.  Wonder about alcohol gastritis or esophagitis.  Do not think that she has cirrhosis so varices are unlikely.  Monitor for recurrence while she is hospitalized.  History of thrombocytopenia Normal platelets on CBC.  No signs of liver disease on exam.  No hepatomegaly  or abdominal pain on exam.  Low index of suspicion for cirrhosis or liver injury. - Follow-up CMP  History of hypothyroidism Last TSH within normal limits.  She is not currently on thyroid replacement therapy.  Diet: Regular IVF: None VTE: enoxaparin (LOVENOX) injection 40 mg Start: 10/01/22 1615 Code: Full Surrogate: Brother, Kendra Frost  Admit patient to Observation with expected length of stay less than 2 midnights.  Signed: [name] MD 10/01/2022, 4:37 PM  Pager: [phone] After 5pm on weekdays and 1pm on weekends: [phone]

## 2022-10-01 NOTE — Progress Notes (Signed)
Patient admitted to the unit, alert and oriented X 4, telemonitored, call bell within reach, bed alarm on, will continue to monitor

## 2022-10-01 NOTE — Assessment & Plan Note (Signed)
Patient has history of alcohol use disorder, and is still currently drinking.  Patient was recently hospitalized from 09/20/2022 to 09/22/2022 for alcohol withdrawal seizures.  Patient herself was trying to self titrate off alcohol, and developed a alcohol withdrawal seizure.  In the hospital, patient was started on a Librium taper, which patient finished outpatient.  Patient was also discharged on naltrexone.  Please see visit from 4 days ago for more details, but in summary, patient is ready for alcohol detox at this time.  She is agreeable to admission.  She states she is ready to cut out her alcohol intake.  She does state her most recent drink was about 1 hour ago, and she had some Truly hard seltzers.  Plan will be to admit patient for observation for alcohol withdrawal.    Plan: -Plan to admit for alcohol detox

## 2022-10-01 NOTE — Progress Notes (Signed)
   CC: Alcohol use disorder   This is a telephone encounter between Kendra Frost and Kendra Frost on 10/01/2022 for alcohol use disorder. The visit was conducted with the patient located at home and Kendra Frost at [facility]. The patient's identity was confirmed using their DOB and current address. The patient has consented to being evaluated through a telephone encounter and understands the associated risks (an examination cannot be done and the patient may need to come in for an appointment) / benefits (allows the patient to remain at home, decreasing exposure to coronavirus). I personally spent 10 minutes on medical discussion.   HPI:  Kendra Frost is a 42 y.o. with PMH as below.   Please see A&P for assessment of the patient's acute and chronic medical conditions.   Past Medical History:  Diagnosis Date   Alcohol abuse    Thyroid disease    Review of Systems:    Negative except what is stated in the HPI.     Assessment & Plan:   Thrombocytopenia (HCC) On most recent CBC, platelet count within normal limits.  This is likely reactive to acute seizures.  Plan: -Continue monitor platelet count  Alcohol use disorder Patient has history of alcohol use disorder, and is still currently drinking.  Patient was recently hospitalized from 09/20/2022 to 09/22/2022 for alcohol withdrawal seizures.  Patient herself was trying to self titrate off alcohol, and developed a alcohol withdrawal seizure.  In the hospital, patient was started on a Librium taper, which patient finished outpatient.  Patient was also discharged on naltrexone.  Please see visit from 4 days ago for more details, but in summary, patient is ready for alcohol detox at this time.  She is agreeable to admission.  She states she is ready to cut out her alcohol intake.  She does state her most recent drink was about 1 hour ago, and she had some Truly hard seltzers.  Plan will be to admit patient for  observation for alcohol withdrawal.    Plan: -Plan to admit for alcohol detox   Patient discussed with Dr. Oren Bracket, DO  Internal Medicine Resident

## 2022-10-01 NOTE — Progress Notes (Signed)
Erroneous - patient was already seen by Dr. Allena Katz on 09/28/22 for hospital follow up

## 2022-10-01 NOTE — Assessment & Plan Note (Signed)
On most recent CBC, platelet count within normal limits.  This is likely reactive to acute seizures.  Plan: -Continue monitor platelet count

## 2022-10-02 ENCOUNTER — Other Ambulatory Visit (HOSPITAL_COMMUNITY): Payer: Self-pay

## 2022-10-02 DIAGNOSIS — K921 Melena: Secondary | ICD-10-CM | POA: Diagnosis not present

## 2022-10-02 DIAGNOSIS — N939 Abnormal uterine and vaginal bleeding, unspecified: Secondary | ICD-10-CM | POA: Diagnosis not present

## 2022-10-02 DIAGNOSIS — F17210 Nicotine dependence, cigarettes, uncomplicated: Secondary | ICD-10-CM | POA: Diagnosis not present

## 2022-10-02 DIAGNOSIS — E039 Hypothyroidism, unspecified: Secondary | ICD-10-CM | POA: Diagnosis not present

## 2022-10-02 DIAGNOSIS — F10939 Alcohol use, unspecified with withdrawal, unspecified: Secondary | ICD-10-CM | POA: Diagnosis not present

## 2022-10-02 LAB — PREGNANCY, URINE: Preg Test, Ur: NEGATIVE

## 2022-10-02 MED ORDER — FOLIC ACID 1 MG PO TABS
1.0000 mg | ORAL_TABLET | Freq: Every day | ORAL | Status: DC
  Administered 2022-10-02 – 2022-10-03 (×2): 1 mg via ORAL
  Filled 2022-10-02 (×2): qty 1

## 2022-10-02 MED ORDER — ACETAMINOPHEN 325 MG PO TABS
650.0000 mg | ORAL_TABLET | Freq: Four times a day (QID) | ORAL | Status: DC | PRN
  Administered 2022-10-02 – 2022-10-03 (×3): 650 mg via ORAL
  Filled 2022-10-02 (×3): qty 2

## 2022-10-02 MED ORDER — NALTREXONE HCL 50 MG PO TABS
50.0000 mg | ORAL_TABLET | Freq: Every day | ORAL | Status: DC
  Administered 2022-10-02 – 2022-10-03 (×2): 50 mg via ORAL
  Filled 2022-10-02 (×2): qty 1

## 2022-10-02 MED ORDER — RIVAROXABAN 10 MG PO TABS
10.0000 mg | ORAL_TABLET | Freq: Every day | ORAL | Status: DC
  Administered 2022-10-02: 10 mg via ORAL
  Filled 2022-10-02: qty 1

## 2022-10-02 MED ORDER — PANTOPRAZOLE SODIUM 40 MG PO TBEC
40.0000 mg | DELAYED_RELEASE_TABLET | Freq: Every day | ORAL | Status: DC
  Administered 2022-10-02 – 2022-10-03 (×2): 40 mg via ORAL
  Filled 2022-10-02 (×2): qty 1

## 2022-10-02 MED ORDER — ADULT MULTIVITAMIN W/MINERALS CH
1.0000 | ORAL_TABLET | Freq: Every day | ORAL | Status: DC
  Administered 2022-10-02 – 2022-10-03 (×2): 1 via ORAL
  Filled 2022-10-02 (×2): qty 1

## 2022-10-02 MED ORDER — THIAMINE MONONITRATE 100 MG PO TABS
100.0000 mg | ORAL_TABLET | Freq: Every day | ORAL | Status: DC
  Administered 2022-10-02 – 2022-10-03 (×2): 100 mg via ORAL
  Filled 2022-10-02 (×2): qty 1

## 2022-10-02 NOTE — Plan of Care (Signed)
?  Problem: Education: ?Goal: Knowledge of General Education information will improve ?Description: Including pain rating scale, medication(s)/side effects and non-pharmacologic comfort measures ?Outcome: Progressing ?  ?Problem: Activity: ?Goal: Risk for activity intolerance will decrease ?Outcome: Progressing ?  ?Problem: Nutrition: ?Goal: Adequate nutrition will be maintained ?Outcome: Progressing ?  ?Problem: Coping: ?Goal: Level of anxiety will decrease ?Outcome: Progressing ?  ?Problem: Elimination: ?Goal: Will not experience complications related to urinary retention ?Outcome: Progressing ?  ?Problem: Pain Managment: ?Goal: General experience of comfort will improve ?Outcome: Progressing ?  ?

## 2022-10-02 NOTE — Plan of Care (Signed)
  Problem: Education: Goal: Knowledge of General Education information will improve Description Including pain rating scale, medication(s)/side effects and non-pharmacologic comfort measures Outcome: Progressing   Problem: Health Behavior/Discharge Planning: Goal: Ability to manage health-related needs will improve Outcome: Progressing   

## 2022-10-02 NOTE — Progress Notes (Signed)
                 Interval history Feeling well overall.  Had some chills and hot flashes overnight.  Has a very mild tremor.  Headache responded well to Toradol.  Reports some vaginal bleeding, which is unusual as she has had amenorrhea for the last 10 months.  She has some lower abdominal cramping with this.  Checked in with her in the afternoon, she reports hearing a female's voice in her room despite no female present.  Physical exam Blood pressure (!) 128/98, pulse 79, temperature 98.9 F (37.2 C), temperature source Oral, resp. rate 16, height 5\' 1"  (1.549 m), SpO2 98 %.  Overall well-appearing Heart rate is normal, rhythm is regular, radial pulses are strong Breathing is regular and unlabored on room air Abdomen is diffusely tender in the suprapubic region and left and right lower quadrants Skin is warm and dry Alert and oriented Pleasant, concordant affect, reports auditory hallucinations  Assessment and plan Hospital day 0  Kendra Frost is a 42 y.o. with alcohol use disorder admitted for medically monitored alcohol withdrawal.  Principal Problem:   Alcohol withdrawal (HCC) Active Problems:   Alcohol use disorder  Alcohol withdrawal Alcohol use disorder Very mild syndrome.  CIWA scores were low overnight.  She has not required any Ativan.  Her symptoms are mostly subjective.  She may be having some hallucinosis, she reports auditory hallucinations.  My impression is that she is still at risk for severe syndrome, but I think given this latter finding it is justifiable to continue observation overnight. - Continue CIWA score - Ativan for objective signs of alcohol withdrawal - Start naltrexone - Empiric thiamine and folate  Headache In setting of alcohol withdrawal. - As needed acetaminophen or Toradol  Abnormal uterine bleeding Reports 10 months of amenorrhea prior to this, thought that she was perimenopausal.  She had some spotting a few weeks ago.  Early  this morning she had some heavier vaginal bleeding like a period.  Uncertain about pregnancy, she cannot rule it out.  Will defer pelvic exam to outpatient setting as there are no high risk findings for this vaginal bleeding.  She is not anemic.  Low suspicion for ectopic pregnancy. - Urine pregnancy test  Diet: Regular IVF: None VTE: rivaroxaban (XARELTO) tablet 10 mg Start: 10/02/22 1700  Code: Full Family Update: Brother, by phone  Discharge plan: pending    [name] MD 10/02/2022, 7:36 AM  Pager: [phone] After 5pm or weekend: [phone]

## 2022-10-02 NOTE — Plan of Care (Signed)
  Problem: Clinical Measurements: Goal: Diagnostic test results will improve Outcome: Not Progressing   Problem: Pain Managment: Goal: General experience of comfort will improve Outcome: Not Progressing   Problem: Safety: Goal: Ability to remain free from injury will improve Outcome: Not Progressing   

## 2022-10-02 NOTE — Progress Notes (Signed)
   10/02/22 1135  Spiritual Encounters  Type of Visit Initial  Care provided to: Patient  Referral source Patient request  Reason for visit Advance directives  OnCall Visit No  Advance Directives (For Healthcare)  Does Patient Have a Medical Advance Directive? No  Would patient like information on creating a medical advance directive? Yes (Inpatient - patient requests chaplain consult to create a medical advance directive)   Chaplain Caterra Ostroff responded to a spiritual consult for AD. Chaplain provided education and patient will alert staff when/if she wants to complete and notarize the documents. Documents were provided to the patient and left with her.   Arlyce Dice, Chaplain Resident [phone]

## 2022-10-02 NOTE — Hospital Course (Addendum)
Sweating, feeling hot and cold. Denies hallucinations. Denies tremors. Reports started a period yesterday after not having a period for 10 months. Reports spotting a couple of weeks ago. Some abdominal pain. Reports cramps all across. Last pelvic exam 2 years ago. Discussed no overt signs of alcohol withdrawal. Discussed naltrexone.  -added naltrexone and PRN tylenol  Principal Problem:   Alcohol withdrawal (HCC) Active Problems:   Alcohol use disorder  Resolved Problems:   * No resolved hospital problems. *  Consults:***  Procedures:***  Follow-up items: - GI - AUB

## 2022-10-03 ENCOUNTER — Other Ambulatory Visit (HOSPITAL_COMMUNITY): Payer: Self-pay

## 2022-10-03 DIAGNOSIS — E039 Hypothyroidism, unspecified: Secondary | ICD-10-CM | POA: Diagnosis not present

## 2022-10-03 DIAGNOSIS — F10930 Alcohol use, unspecified with withdrawal, uncomplicated: Secondary | ICD-10-CM | POA: Diagnosis not present

## 2022-10-03 DIAGNOSIS — F17210 Nicotine dependence, cigarettes, uncomplicated: Secondary | ICD-10-CM | POA: Diagnosis not present

## 2022-10-03 DIAGNOSIS — K921 Melena: Secondary | ICD-10-CM | POA: Diagnosis not present

## 2022-10-03 DIAGNOSIS — F10939 Alcohol use, unspecified with withdrawal, unspecified: Secondary | ICD-10-CM | POA: Diagnosis not present

## 2022-10-03 DIAGNOSIS — N939 Abnormal uterine and vaginal bleeding, unspecified: Secondary | ICD-10-CM | POA: Diagnosis not present

## 2022-10-03 MED ORDER — PANTOPRAZOLE SODIUM 40 MG PO TBEC
40.0000 mg | DELAYED_RELEASE_TABLET | Freq: Every day | ORAL | 2 refills | Status: DC
  Filled 2022-10-03: qty 30, 30d supply, fill #0

## 2022-10-03 MED ORDER — ACETAMINOPHEN 500 MG PO TABS
1000.0000 mg | ORAL_TABLET | Freq: Three times a day (TID) | ORAL | Status: DC | PRN
Start: 2022-10-03 — End: 2023-02-01

## 2022-10-03 NOTE — Discharge Summary (Signed)
Name: Kendra Frost MRN: [id number] DOB: February 28, 1981 42 y.o. PCP: No primary care provider on file.  Date of Admission: 10/01/2022  2:38 PM Date of Discharge: 10/03/2022 10:38 AM Attending Physician: Gust Rung, DO  Discharge Diagnosis: Active Problems:   Alcohol use disorder  Principal Problem (Resolved):   Alcohol withdrawal [facility])   Discharge Medications: Allergies as of 10/03/2022   No Known Allergies      Medication List     TAKE these medications    acetaminophen 500 MG tablet Commonly known as: TYLENOL Take 2 tablets (1,000 mg total) by mouth 3 (three) times daily as needed for mild pain or headache.   CertaVite/Antioxidants Tabs Take 1 tablet by mouth daily.   folic acid 1 MG tablet Commonly known as: FOLVITE Take 1 tablet (1 mg total) by mouth daily.   naltrexone 50 MG tablet Commonly known as: DEPADE Take 1 tablet (50 mg total) by mouth daily.   pantoprazole 40 MG tablet Commonly known as: PROTONIX Take 1 tablet (40 mg total) by mouth daily.   thiamine 100 MG tablet Commonly known as: VITAMIN B1 Take 1 tablet (100 mg total) by mouth daily.        Follow-up Appointments:  Follow-up Information     Modena Slater, DO. Go on 10/17/2022.   Why: Please arrive 15 minutes early for your appointment at 10:15 AM with Dr. Allena Katz. Contact information: [address] [place] [address] [phone]                 Disposition and follow-up: Kendra Frost is a 42 y.o. year old hospitalized for medically monitored alcohol withdrawal.  Alcohol use disorder Did not have a serious withdrawal syndrome.  Required no benzodiazepine treatment.  Discharged on naltrexone. - Naltrexone daily  Melena Reported dark tarry stools, intermittently for some time.  No melena reported during hospitalization.  No anemia or overt rectal bleeding.  No hematemesis.  Low suspicion for cirrhosis and esophageal varices.  Started on  Protonix due to risk for alcohol related gastritis. - Protonix 40 mg p.o. daily - Reassess, consider GI referral  Abnormal uterine bleeding Had what was described as a period while hospitalized, despite having amenorrhea for the last 10 months.  She figured she was perimenopausal.  Associated with some lower abdominal cramping but no red flags.  Pregnancy ruled out.  Will need workup including pelvic exam in outpatient setting.  Hospital Course by problem list: Medically monitored alcohol withdrawal Alcohol use disorder Very mild syndrome.  Had headache, tremor.  Reported briefly hearing a female's voice despite no female in the room the morning of her first hospital stay.  CIWA scores remain low.  No benzodiazepines were required.  Was discharged after 2 days with prescription for naltrexone.  Also continued empiric thiamine and folate therapy.  Abnormal uterine bleeding 10 months of amenorrhea prior to this episode of bleeding.  Described as though it is a period.  Pregnancy test was negative.  Pelvic exam deferred for outpatient setting as there were no high risk findings for this vaginal bleeding.  No anemia.  Discharge Exam: Night not too bad. Some sweating. Discussed safe disposition from hospital. Discussed out of window for high risk alcohol withdrawal. Discussed will discharge on naltrexone. Discussed abstinence from alcohol. Her plan is to not purchase any alcohol. Reports she ran out of librium but she has naltrexone at home. Discussed clinic follow-up and support with abstinence. Discussed negative pregnancy test. Discussed  starting PPI for darker stools.    Blood pressure (!) 139/103, pulse 76, temperature 97.8 F (36.6 C), temperature source Oral, resp. rate 14, height 5\' 1"  (1.549 m), weight 52.5 kg, SpO2 100 %.  Comfortable appearing Heart rate is normal, rhythm is regular, left radial pulses strong Breathing is regular and unlabored on room air Skin is warm and dry Alert and  oriented, mildly tremulous Pleasant, concordant affect  Pertinent studies and procedures:  Lab Orders         Comprehensive metabolic panel         CBC         Pregnancy, urine     Discharge Instructions:   Discharge Instructions      To Kendra Frost or their caretakers,  They were admitted to [facility] on 10/01/2022 for evaluation and treatment of:  Active Problems:   Alcohol use disorder  Principal Problem (Resolved):   Alcohol withdrawal (HCC)  The evaluation suggested alcohol use disorder and low risk alcohol withdrawal. They were treated medical monitoring and naltrexone for alcohol cravings.  They were discharged from the hospital on 10/03/22. I recommend the following after leaving the hospital:   Continue taking naltrexone daily after you return home.  This will help reduce cravings for alcohol and may decrease the positive reinforcement you received from drinking.  Follow-up with your primary care doctor in the internal medicine center on 10/17/2022 at 10:15 AM.  There, you will address alcohol use, abnormal uterine bleeding, and risk for alcohol related gastritis.  Start taking pantoprazole daily for acid suppression given your risk for alcohol related gastritis.  [name] MD 10/03/2022, 10:29 AM      [name] MD 10/03/2022, 10:38 AM

## 2022-10-03 NOTE — TOC Transition Note (Signed)
Transition of Care [facility]) - CM/SW Discharge Note   Patient Details  Name: Kendra Frost MRN: [id number] Date of Birth: 1981-05-17  [facility] CM/SW Contact:  Kermit Balo, RN Phone Number: 10/03/2022, 12:19 PM   Clinical Narrative:    Pt is discharging home with self care. Pt states her brother is going to provide transportation home.  DME at home: cane Pt drives self as needed and denies any issues with home medications. CM talked to her about food insecurities. She states that since she and her brother are now working that is not an issue. CM encouraged her to use the food pantries if this becomes a problem.  CM also provided her with inpatient/ outpatient alcohol counseling resources.    Final next level of care: Home/Self Care Barriers to Discharge: No Barriers Identified   Patient Goals and CMS Choice      Discharge Placement                         Discharge Plan and Services Additional resources added to the After Visit Summary for                                       Social Determinants of Health (SDOH) Interventions SDOH Screenings   Food Insecurity: Food Insecurity Present (10/01/2022)  Housing: Low Risk  (10/01/2022)  Transportation Needs: No Transportation Needs (10/01/2022)  Utilities: Not At Risk (10/01/2022)  Depression (PHQ2-9): High Risk (10/01/2022)  Tobacco Use: High Risk (10/01/2022)     Readmission Risk Interventions     No data to display

## 2022-10-17 ENCOUNTER — Encounter: Payer: Self-pay | Admitting: Student

## 2022-10-17 ENCOUNTER — Ambulatory Visit (INDEPENDENT_AMBULATORY_CARE_PROVIDER_SITE_OTHER): Payer: BC Managed Care – PPO | Admitting: Student

## 2022-10-17 ENCOUNTER — Other Ambulatory Visit (HOSPITAL_COMMUNITY)
Admission: RE | Admit: 2022-10-17 | Discharge: 2022-10-17 | Disposition: A | Discharge status: Home / Self Care | Payer: BC Managed Care – PPO | Source: Ambulatory Visit | Attending: Internal Medicine | Admitting: Internal Medicine

## 2022-10-17 VITALS — BP 129/95 | HR 84 | Temp 98.2°F | Ht 61.0 in | Wt 113.5 lb

## 2022-10-17 DIAGNOSIS — Z124 Encounter for screening for malignant neoplasm of cervix: Secondary | ICD-10-CM | POA: Insufficient documentation

## 2022-10-17 DIAGNOSIS — F1090 Alcohol use, unspecified, uncomplicated: Secondary | ICD-10-CM

## 2022-10-17 DIAGNOSIS — R195 Other fecal abnormalities: Secondary | ICD-10-CM | POA: Insufficient documentation

## 2022-10-17 DIAGNOSIS — N939 Abnormal uterine and vaginal bleeding, unspecified: Secondary | ICD-10-CM | POA: Insufficient documentation

## 2022-10-17 MED ORDER — CERTAVITE/ANTIOXIDANTS PO TABS: 1.0000 | ORAL_TABLET | Freq: Every day | ORAL | Status: DC

## 2022-10-17 MED ORDER — PANTOPRAZOLE SODIUM 40 MG PO TBEC: 40.0000 mg | DELAYED_RELEASE_TABLET | Freq: Every day | ORAL | 2 refills | Status: DC

## 2022-10-17 MED ORDER — THIAMINE HCL 100 MG PO TABS: 100.0000 mg | ORAL_TABLET | Freq: Every day | ORAL | 2 refills | Status: AC

## 2022-10-17 MED ORDER — FOLIC ACID 1 MG PO TABS: 1.0000 mg | ORAL_TABLET | Freq: Every day | ORAL | 11 refills | Status: DC

## 2022-10-17 MED ORDER — NALTREXONE HCL 50 MG PO TABS: 50.0000 mg | ORAL_TABLET | Freq: Every day | ORAL | 0 refills | Status: AC

## 2022-10-17 NOTE — Patient Instructions (Signed)
Kendra Frost you for allowing me to take part in your care today.  Here are your instructions.  1. Regarding your alcohol use, keep up the good work with stopping use.   2. I have filled your medications   3. I have done a PAP smear today, please wait for my phone call for the results.  4. Please follow up in about 1 month and we can see how you are doing. We can also check  your blood pressure at that time.   5. If you do take any NSAIDs like ibuprofen, naproxen, BC powders, or goody powders, please do not take these as these can cause bleeding.    Thank you, Dr. Allena Katz  If you have any other questions please contact the internal medicine clinic at [phone]

## 2022-10-17 NOTE — Assessment & Plan Note (Signed)
Pap smear obtained today.  Plan: -Follow-up results

## 2022-10-17 NOTE — Progress Notes (Signed)
CC: Hospital Follow up  HPI:  [name] is a 42 y.o. female with a past medical history of alcohol use and hypothyroidism who presents for hospital follow-up.  Patient was recently hospitalized from 05/13-05/15 for alcohol detox.  Please see assessment and plan for full HPI.  Past Medical History:  Diagnosis Date   Alcohol abuse    Thyroid disease      Current Outpatient Medications:    acetaminophen (TYLENOL) 500 MG tablet, Take 2 tablets (1,000 mg total) by mouth 3 (three) times daily as needed for mild pain or headache., Disp: , Rfl:    folic acid (FOLVITE) 1 MG tablet, Take 1 tablet (1 mg total) by mouth daily., Disp: 30 tablet, Rfl: 11   Multiple Vitamins-Minerals (CERTAVITE/ANTIOXIDANTS) TABS, Take 1 tablet by mouth daily., Disp: , Rfl:    naltrexone (DEPADE) 50 MG tablet, Take 1 tablet (50 mg total) by mouth daily., Disp: 90 tablet, Rfl: 0   pantoprazole (PROTONIX) 40 MG tablet, Take 1 tablet (40 mg total) by mouth daily., Disp: 30 tablet, Rfl: 2   thiamine (VITAMIN B1) 100 MG tablet, Take 1 tablet (100 mg total) by mouth daily., Disp: 30 tablet, Rfl: 2  Review of Systems:    Negative except what is stated on HPI.   Physical Exam:  Vitals:   10/17/22 1045 10/17/22 1135  BP: (!) 137/100 (!) 129/95  Pulse: 94 84  Temp: 98.2 F (36.8 C)   TempSrc: Oral   SpO2: 100%   Weight: 113 lb 8 oz (51.5 kg)   Height: 5\' 1"  (1.549 m)    General: Patient is sitting comfortably in the room  Head: Normocephalic, atraumatic  Cardio: Regular rate and rhythm, no murmurs, rubs or gallops. 2+ pulses to bilateral upper and lower extremities  Pulmonary: Clear to ausculation bilaterally with no rales, rhonchi, and crackles  GU: Vaginal exam grossly normal.  Physiological discharge noted.  No bumps, rashes, masses, or gross appreciated   Assessment & Plan:   Alcohol use disorder Patient presents for hospital follow-up.  Patient was recently admitted to the  hospital on 05/13-05/15 for alcohol detox.  Please see my previous notes about this.  Patient reports that during hospitalization, she did well.  Per charting patient did not require any benzodiazepines during hospital visit.  Patient was discharged on folic acid, thiamine, as well as naltrexone.  Patient states 8 to 9 days after her discharge she started drinking some seltzers again.  She states she only had 1-2 per day over the weekend.  She states since 3 days ago she has not had any alcohol.  She states the naltrexone is working for her cravings.  She has not had any wine or liquor.  She reports that she is willing to go to alcohol Anonymous meetings at the church [place] the [place] from where she lives.  She states she would like to get more resources from social work as well.  She denies any hallucinations.  Plan: -Refer to social work -Refill naltrexone -Continue thiamine and folic acid supplementation -Continue to follow-up, follow-up in 1 month  Dark stools Patient reports having dark stools with alcohol use.  She states when she does not drink she does not have these dark stools.  She states she has not had some recently.  She does state that at times she does have dark stools.  She denies any black or tarry stools.  Patient has had same concerns during hospitalization.  She denies any abdominal  pain.  There is some concern that this could be alcohol related gastritis.  Patient was prescribed pantoprazole for this during hospital visit.  She reports that it is working well and requests a refill.  She denies any lightheadedness or syncopal episodes.  She denies any bloody stools.  She denies use of ibuprofen, naproxen, BC powders, Goody powders.  I do not think this warrants a EGD or colonoscopy at this point.  If she does continue to have abdominal pain or starts having symptomatic anemia this will warrant an EGD or colonoscopy.  Plan: -Continue pantoprazole 40 mg daily -Continue to  monitor  Screening for cervical cancer Pap smear obtained today.  Plan: -Follow-up results  Vaginal bleeding problems Patient endorses that over the past 10 months she has had low menstrual cycle, but over the last admission she developed a menstrual cycle.  She describes this as spotting.  She states she is not bleeding today.  She states otherwise she is doing well.  She states she is not pregnant.  She denies any other vaginal symptoms.  Patient recently had pregnancy test about 2 weeks ago which was negative.  On exam, I do not see any gross, masses, or any lesions that could be causing this bleeding.  Patient could be perimenopausal and slowly will no longer have menstrual cycles.  Plan: -Continue to monitor -PAP smear today  Patient seen with Dr. Marquita Palms, DO PGY-1 Internal Medicine Resident  Pager: [phone]

## 2022-10-17 NOTE — Assessment & Plan Note (Signed)
Patient endorses that over the past 10 months she has had low menstrual cycle, but over the last admission she developed a menstrual cycle.  She describes this as spotting.  She states she is not bleeding today.  She states otherwise she is doing well.  She states she is not pregnant.  She denies any other vaginal symptoms.  Patient recently had pregnancy test about 2 weeks ago which was negative.  On exam, I do not see any gross, masses, or any lesions that could be causing this bleeding.  Patient could be perimenopausal and slowly will no longer have menstrual cycles.  Plan: -Continue to monitor -PAP smear today

## 2022-10-17 NOTE — Assessment & Plan Note (Signed)
Patient presents for hospital follow-up.  Patient was recently admitted to the hospital on 05/13-05/15 for alcohol detox.  Please see my previous notes about this.  Patient reports that during hospitalization, she did well.  Per charting patient did not require any benzodiazepines during hospital visit.  Patient was discharged on folic acid, thiamine, as well as naltrexone.  Patient states 8 to 9 days after her discharge she started drinking some seltzers again.  She states she only had 1-2 per day over the weekend.  She states since 3 days ago she has not had any alcohol.  She states the naltrexone is working for her cravings.  She has not had any wine or liquor.  She reports that she is willing to go to alcohol Anonymous meetings at the church across the [place] from where she lives.  She states she would like to get more resources from social work as well.  She denies any hallucinations.  Plan: -Refer to social work -Refill naltrexone -Continue thiamine and folic acid supplementation -Continue to follow-up, follow-up in 1 month

## 2022-10-17 NOTE — Assessment & Plan Note (Signed)
Patient reports having dark stools with alcohol use.  She states when she does not drink she does not have these dark stools.  She states she has not had some recently.  She does state that at times she does have dark stools.  She denies any black or tarry stools.  Patient has had same concerns during hospitalization.  She denies any abdominal pain.  There is some concern that this could be alcohol related gastritis.  Patient was prescribed pantoprazole for this during hospital visit.  She reports that it is working well and requests a refill.  She denies any lightheadedness or syncopal episodes.  She denies any bloody stools.  She denies use of ibuprofen, naproxen, BC powders, Goody powders.  I do not think this warrants a EGD or colonoscopy at this point.  If she does continue to have abdominal pain or starts having symptomatic anemia this will warrant an EGD or colonoscopy.  Plan: -Continue pantoprazole 40 mg daily -Continue to monitor

## 2022-10-22 NOTE — Progress Notes (Signed)
Internal Medicine Clinic Attending ? ?Case discussed with Dr. Liang  At the time of the visit.  We reviewed the resident?s history and exam and pertinent patient test results.  I agree with the assessment, diagnosis, and plan of care documented in the resident?s note. ? ?

## 2022-10-24 LAB — CYTOLOGY - PAP
Adequacy: ABSENT
Comment: NEGATIVE
Diagnosis: NEGATIVE
High risk HPV: NEGATIVE

## 2022-11-28 ENCOUNTER — Encounter (HOSPITAL_COMMUNITY): Payer: Self-pay

## 2022-11-28 ENCOUNTER — Other Ambulatory Visit: Payer: Self-pay

## 2022-11-28 ENCOUNTER — Emergency Department (HOSPITAL_COMMUNITY)
Admission: EM | Admit: 2022-11-28 | Discharge: 2022-11-28 | Disposition: A | Discharge status: Home / Self Care | Payer: BC Managed Care – PPO | Attending: Emergency Medicine | Admitting: Emergency Medicine

## 2022-11-28 DIAGNOSIS — R102 Pelvic and perineal pain: Secondary | ICD-10-CM | POA: Insufficient documentation

## 2022-11-28 DIAGNOSIS — N939 Abnormal uterine and vaginal bleeding, unspecified: Secondary | ICD-10-CM | POA: Diagnosis not present

## 2022-11-28 DIAGNOSIS — E039 Hypothyroidism, unspecified: Secondary | ICD-10-CM | POA: Diagnosis not present

## 2022-11-28 LAB — BASIC METABOLIC PANEL
Anion gap: 13 (ref 5–15)
BUN: 7 mg/dL (ref 6–20)
CO2: 18 mmol/L — ABNORMAL LOW (ref 22–32)
Calcium: 9 mg/dL (ref 8.9–10.3)
Chloride: 105 mmol/L (ref 98–111)
Creatinine, Ser: 0.61 mg/dL (ref 0.44–1.00)
GFR, Estimated: >60 mL/min (ref >60)
Glucose, Bld: 101 mg/dL — ABNORMAL HIGH (ref 70–99)
Potassium: 3.8 mmol/L (ref 3.5–5.1)
Sodium: 136 mmol/L (ref 135–145)

## 2022-11-28 LAB — CBC WITH DIFFERENTIAL/PLATELET
Abs Immature Granulocytes: 0.01 10*3/uL (ref 0.00–0.07)
Basophils Absolute: 0.1 10*3/uL (ref 0.0–0.1)
Basophils Relative: 2 %
Eosinophils Absolute: 0.2 10*3/uL (ref 0.0–0.5)
Eosinophils Relative: 3 %
HCT: 37.7 % (ref 36.0–46.0)
Hemoglobin: 12.8 g/dL (ref 12.0–15.0)
Immature Granulocytes: 0 %
Lymphocytes Relative: 67 %
Lymphs Abs: 4.5 10*3/uL — ABNORMAL HIGH (ref 0.7–4.0)
MCH: 34 pg (ref 26.0–34.0)
MCHC: 34 g/dL (ref 30.0–36.0)
MCV: 100.3 fL — ABNORMAL HIGH (ref 80.0–100.0)
Monocytes Absolute: 0.4 10*3/uL (ref 0.1–1.0)
Monocytes Relative: 7 %
Neutro Abs: 1.4 10*3/uL — ABNORMAL LOW (ref 1.7–7.7)
Neutrophils Relative %: 21 %
Platelets: 209 10*3/uL (ref 150–400)
RBC: 3.76 MIL/uL — ABNORMAL LOW (ref 3.87–5.11)
RDW: 13.7 % (ref 11.5–15.5)
WBC: 6.5 10*3/uL (ref 4.0–10.5)
nRBC: 0 % (ref 0.0–0.2)

## 2022-11-28 LAB — HCG, QUANTITATIVE, PREGNANCY: hCG, Beta Chain, Quant, S: 1 m[IU]/mL (ref <5)

## 2022-11-28 MED ORDER — ACETAMINOPHEN 500 MG PO TABS
1000.0000 mg | ORAL_TABLET | Freq: Once | ORAL | Status: AC
  Administered 2022-11-28: 1000 mg via ORAL
  Filled 2022-11-28: qty 2

## 2022-11-28 MED ORDER — KETOROLAC TROMETHAMINE 15 MG/ML IJ SOLN
15.0000 mg | Freq: Once | INTRAMUSCULAR | Status: AC
  Administered 2022-11-28: 15 mg via INTRAMUSCULAR
  Filled 2022-11-28: qty 1

## 2022-11-28 NOTE — ED Provider Notes (Signed)
[place] EMERGENCY DEPARTMENT AT [facility] Provider Note   CSN: [id number] Arrival date & time: 11/28/22  1443     History  Chief Complaint  Patient presents with   Abdominal Pain   Vaginal Bleeding    Kendra Frost is a 42 y.o. female with alcohol use w/ h/o withdrawal, hypothyroidism, vaginal bleeding who presents with abdominal pain and vaginal bleeding.   Pt c/o constant bilateral lower pelvic pain that radiates into her lower back down both legs started today, feels cramping. Pt c/o vaginal bleeding 6/4-6/23 then started again yesterday. Pt states she's passing a lot of clots and its very heavy. It has slowed down over the course of the day. Pt c/o dizziness and generalized weakness started today. She denies f/c, h/o abdominal surgeries, states she is not pregnant. Denies SOB, CP.   Per chart review patient was admitted from 10/01/2022 to 10/03/2022 for alcohol use disorder and alcohol withdrawal.  She required no benzodiazepine treatment and was discharged on naltrexone.  Had reported dark tarry stools at that time, but no melena was noted and then hospitalization.    Abdominal Pain Associated symptoms: vaginal bleeding   Vaginal Bleeding Associated symptoms: abdominal pain        Home Medications Prior to Admission medications   Medication Sig Start Date End Date Taking? Authorizing Provider  acetaminophen (TYLENOL) 500 MG tablet Take 2 tablets (1,000 mg total) by mouth 3 (three) times daily as needed for mild pain or headache. 10/03/22   [name], MD  folic acid (FOLVITE) 1 MG tablet Take 1 tablet (1 mg total) by mouth daily. 10/17/22 10/17/23  Modena Slater, DO  Multiple Vitamins-Minerals (CERTAVITE/ANTIOXIDANTS) TABS Take 1 tablet by mouth daily. 10/17/22 10/17/23  Modena Slater, DO  naltrexone (DEPADE) 50 MG tablet Take 1 tablet (50 mg total) by mouth daily. 10/17/22 01/15/23  Modena Slater, DO  pantoprazole (PROTONIX) 40 MG tablet Take 1  tablet (40 mg total) by mouth daily. 10/17/22 01/15/23  Modena Slater, DO  thiamine (VITAMIN B1) 100 MG tablet Take 1 tablet (100 mg total) by mouth daily. 10/17/22 01/15/23  Modena Slater, DO      Allergies    Patient has no known allergies.    Review of Systems   Review of Systems  Gastrointestinal:  Positive for abdominal pain.  Genitourinary:  Positive for vaginal bleeding.   A 10 point review of systems was performed and is negative unless otherwise reported in HPI.  Physical Exam Updated Vital Signs BP 100/78 (BP Location: Right Arm)   Pulse 97   Temp 98.6 F (37 C) (Oral)   Resp 18   Ht 5\' 1"  (1.549 m)   Wt 51.5 kg   SpO2 99%   BMI 21.45 kg/m  Physical Exam General: Normal appearing female, lying in bed.  HEENT: Sclera anicteric, MMM, trachea midline.  Cardiology: RRR, no murmurs/rubs/gallops. BL radial and DP pulses equal bilaterally.  Resp: Normal respiratory rate and effort. CTAB, no wheezes, rhonchi, crackles.  Abd: Mild BL LQ abd TTP. Soft, non-distended. No rebound tenderness or guarding.  Pelvic: Normal-appearing external genitalia.  Mild amount of blood in the vaginal vault.  No active bleeding from the cervical os.  Cervical os is closed and normal in appearance.  No lesions or lacerations to the vaginal wall.  No cervical motion tenderness to palpation. MSK: No peripheral edema or signs of trauma. Skin: warm, dry Neuro: A&Ox4, CNs II-XII grossly intact. MAEs.  Psych: Normal mood and affect.  ED Results / Procedures / Treatments   Labs (all labs ordered are listed, but only abnormal results are displayed) Labs Reviewed  BASIC METABOLIC PANEL - Abnormal; Notable for the following components:      Result Value   CO2 18 (*)    Glucose, Bld 101 (*)    All other components within normal limits  CBC WITH DIFFERENTIAL/PLATELET - Abnormal; Notable for the following components:   RBC 3.76 (*)    MCV 100.3 (*)    Neutro Abs 1.4 (*)    Lymphs Abs 4.5 (*)    All other  components within normal limits  HCG, QUANTITATIVE, PREGNANCY    EKG None  Radiology No results found.  Procedures Pelvic exam  Date/Time: 11/30/2022 12:27 PM  Performed by: Loetta Rough, MD Authorized by: Loetta Rough, MD  Consent: Verbal consent obtained. Patient understanding: patient states understanding of the procedure being performed Time out: Immediately prior to procedure a "time out" was called to verify the correct patient, procedure, equipment, support staff and site/side marked as required. Local anesthesia used: no  Anesthesia: Local anesthesia used: no  Sedation: Patient sedated: no  Patient tolerance: patient tolerated the procedure well with no immediate complications       Medications Ordered in ED Medications  acetaminophen (TYLENOL) tablet 1,000 mg (1,000 mg Oral Given 11/28/22 2032)  ketorolac (TORADOL) 15 MG/ML injection 15 mg (15 mg Intramuscular Given 11/28/22 2032)    ED Course/ Medical Decision Making/ A&P                          Medical Decision Making Amount and/or Complexity of Data Reviewed Labs: ordered. Decision-making details documented in ED Course.  Risk OTC drugs. Prescription drug management.    This patient presents to the ED for concern of non-pregnant vaginal bleeding, this involves an extensive number of treatment options, and is a complaint that carries with it a high risk of complications and morbidity.  I considered the following differential and admission for this acute, potentially life threatening condition. She is HDS and overall non-toxic appearing, well appearing.  MDM:    Patient's pregnancy test negative, hemoglobin is stable. No c/f ectopic pregnancy, abortion, or acute/chronic blood loss anemia. In nonpregnant females, vaginal bleeding consider:  Structural causes such as polyps, adenomyosis, leiomyomas, malignancy/hyperplasia. Considered nonstructural causes such as coagulopathies, ovulatory  dysfunction, endometrial bleeding. She has no h/o bleeding diathesis. She seems to describe a history of menorrhagia or abnormal uterine bleeding. She is hemodynamically stable and overall very well-appearing. She is given tylenol/toradol which resolves her pain. Pelvic exam with some blood in vaginal vault, closed cervix, no active bleeding from os.    Clinical Course as of 11/28/22 2301  Wed Nov 28, 2022  1841 Hemoglobin: 12.8 Stable Hgb [HN]  1953 HCG, Beta Chain, Quant, S: 1 Negative [HN]  2109 Patient does smoke cigarettes, which is a contraindication to combined OCPs when patient is older than 43 y/o. Patient is HDS, she is well-appearing, much better after tylenol/toradol.  [HN]  2110 Discussed with patient and her brother over the phone.  Brother has a lot of questions about patient's recent admission with alcohol withdrawal.  He is concerned that she is in alcohol withdrawal now.  I reassured him that she has stable vital signs, no tongue fasciculations or tremoring, no nausea vomiting.  Mother asked patient if she thinks she will go into withdrawal tonight if she does not drink and  she states yes she thinks she will.  Her last drink was at 6 AM this morning.  He asked me if I can prescribe her something to help prevent her withdrawal.  I relayed that I cannot prescribe her anything to prevent her withdrawal, however if she comes back with symptoms of withdrawal to the ED I will treat her appropriately.  They request information for treatment centers which I will give.  Instructed to f/u with gynecology for her menorrhagia/AUB, will give referral and patient reports understanding.  She is given extensive discharge instructions and return precautions.  All questions answered to she and her brother satisfaction..  [HN]    Clinical Course User Index [HN] Loetta Rough, MD    Labs: I Ordered, and personally interpreted labs.  The pertinent results include:  those listed above  Additional  history obtained from chart review.    Reevaluation: After the interventions noted above, I reevaluated the patient and found that they have :improved  Social Determinants of Health: Patient lives independently   Disposition:  DC w/ discharge instructions/return precautions. All questions answered to patient's satisfaction.    Co morbidities that complicate the patient evaluation  Past Medical History:  Diagnosis Date   Alcohol abuse    Thyroid disease      Medicines Meds ordered this encounter  Medications   acetaminophen (TYLENOL) tablet 1,000 mg   ketorolac (TORADOL) 15 MG/ML injection 15 mg    I have reviewed the patients home medicines and have made adjustments as needed  Problem List / ED Course: Problem List Items Addressed This Visit   None Visit Diagnoses     Abnormal uterine bleeding    -  Primary                   This note was created using dictation software, which may contain spelling or grammatical errors.    Loetta Rough, MD 11/30/22 1233

## 2022-11-28 NOTE — Discharge Instructions (Addendum)
Thank you for coming to [facility] Emergency Department. You were seen for vaginal bleeding. We did an exam, labs, and these showed mild vaginal bleeding, stable hemoglobin, stable vitals signs.  Please follow up with your gynecologist within 1-2 weeks. Please see the gynecologist in clinic.  You can alternate taking Tylenol and ibuprofen as needed for pain. You can take 650mg  tylenol (acetaminophen) every 4-6 hours, and 600 mg ibuprofen 3 times a day.   Do not hesitate to return to the ED or call 911 if you experience: -Worsening symptoms -Severe abdominal pain -Saturating >2 pads per hour for 3 hours -Chest pain, shortness of breath -Lightheadedness, passing out -Fevers/chills -Anything else that concerns you

## 2022-11-28 NOTE — ED Notes (Signed)
CHAD(BROTHER) WOULD LIKE TO SPEAK WITH NURSE & OR DOC IN REGARDS TO PT. IF YOU COULD GIVE HIM A CALL ASAP. [phone]

## 2022-11-28 NOTE — ED Triage Notes (Signed)
Pt c/o pelvic pain that radiates down both legs started today. Pt c/o vaginal bleeding 6/4-6/23 then started again yesterday. Pt states she's passing a lot of clots and its very heavy. Pt states she is soaking more than one pad an hour. Pt c/o dizziness and weakness started today.

## 2023-01-14 ENCOUNTER — Institutional Professional Consult (permissible substitution): Payer: Self-pay | Admitting: Licensed Clinical Social Worker

## 2023-02-01 ENCOUNTER — Emergency Department (HOSPITAL_COMMUNITY)
Admission: EM | Admit: 2023-02-01 | Discharge: 2023-02-01 | Disposition: A | Discharge status: Home / Self Care | Payer: BC Managed Care – PPO | Attending: Student | Admitting: Student

## 2023-02-01 ENCOUNTER — Other Ambulatory Visit: Payer: Self-pay

## 2023-02-01 ENCOUNTER — Encounter (HOSPITAL_COMMUNITY): Payer: Self-pay

## 2023-02-01 DIAGNOSIS — E039 Hypothyroidism, unspecified: Secondary | ICD-10-CM | POA: Diagnosis not present

## 2023-02-01 DIAGNOSIS — R55 Syncope and collapse: Secondary | ICD-10-CM

## 2023-02-01 DIAGNOSIS — I10 Essential (primary) hypertension: Secondary | ICD-10-CM | POA: Diagnosis not present

## 2023-02-01 DIAGNOSIS — R569 Unspecified convulsions: Secondary | ICD-10-CM | POA: Diagnosis not present

## 2023-02-01 DIAGNOSIS — F17210 Nicotine dependence, cigarettes, uncomplicated: Secondary | ICD-10-CM | POA: Insufficient documentation

## 2023-02-01 LAB — COMPREHENSIVE METABOLIC PANEL
ALT: 73 U/L — ABNORMAL HIGH (ref 0–44)
AST: 121 U/L — ABNORMAL HIGH (ref 15–41)
Albumin: 3.8 g/dL (ref 3.5–5.0)
Alkaline Phosphatase: 95 U/L (ref 38–126)
Anion gap: 12 (ref 5–15)
BUN: 7 mg/dL (ref 6–20)
CO2: 23 mmol/L (ref 22–32)
Calcium: 9 mg/dL (ref 8.9–10.3)
Chloride: 101 mmol/L (ref 98–111)
Creatinine, Ser: 0.61 mg/dL (ref 0.44–1.00)
GFR, Estimated: >60 mL/min (ref >60)
Glucose, Bld: 89 mg/dL (ref 70–99)
Potassium: 3.9 mmol/L (ref 3.5–5.1)
Sodium: 136 mmol/L (ref 135–145)
Total Bilirubin: 0.8 mg/dL (ref 0.3–1.2)
Total Protein: 7.4 g/dL (ref 6.5–8.1)

## 2023-02-01 LAB — CBC WITH DIFFERENTIAL/PLATELET
Abs Immature Granulocytes: 0.02 10*3/uL (ref 0.00–0.07)
Basophils Absolute: 0.1 10*3/uL (ref 0.0–0.1)
Basophils Relative: 1 %
Eosinophils Absolute: 0.1 10*3/uL (ref 0.0–0.5)
Eosinophils Relative: 1 %
HCT: 39.8 % (ref 36.0–46.0)
Hemoglobin: 13.7 g/dL (ref 12.0–15.0)
Immature Granulocytes: 0 %
Lymphocytes Relative: 28 %
Lymphs Abs: 1.4 10*3/uL (ref 0.7–4.0)
MCH: 34.6 pg — ABNORMAL HIGH (ref 26.0–34.0)
MCHC: 34.4 g/dL (ref 30.0–36.0)
MCV: 100.5 fL — ABNORMAL HIGH (ref 80.0–100.0)
Monocytes Absolute: 0.4 10*3/uL (ref 0.1–1.0)
Monocytes Relative: 8 %
Neutro Abs: 3.2 10*3/uL (ref 1.7–7.7)
Neutrophils Relative %: 62 %
Platelets: 141 10*3/uL — ABNORMAL LOW (ref 150–400)
RBC: 3.96 MIL/uL (ref 3.87–5.11)
RDW: 13.2 % (ref 11.5–15.5)
WBC: 5.1 10*3/uL (ref 4.0–10.5)
nRBC: 0 % (ref 0.0–0.2)

## 2023-02-01 LAB — TROPONIN I (HIGH SENSITIVITY)
Troponin I (High Sensitivity): 8 ng/L (ref <18)
Troponin I (High Sensitivity): 29 ng/L — ABNORMAL HIGH (ref <18)

## 2023-02-01 MED ORDER — LACTATED RINGERS IV BOLUS
1000.0000 mL | Freq: Once | INTRAVENOUS | Status: AC
  Administered 2023-02-01: 1000 mL via INTRAVENOUS

## 2023-02-01 NOTE — ED Triage Notes (Signed)
Per EMS pt did not eat this morning & had a syncopal episode at work lasting about 45 seconds.  Pt reports she got dizzy & had some blurred vision, lowered herself to the floor so she wouldn't fall. No incontinence, pt A&O x 4.

## 2023-02-01 NOTE — Discharge Instructions (Addendum)
It was a pleasure caring for you today in the emergency department. ° °Please return to the emergency department for any worsening or worrisome symptoms. ° ° °

## 2023-02-01 NOTE — Hospital Course (Addendum)
42 y/o f with aud and aub presenting with syncoipe w/ prodrome. Admitted 09/2022 for similar but c/b decrease etoh and reported tonic/clonic movements. No syncope between but today had a few episodes of light head and blurry vision. No assi  Then admitted with AUB later that month w/ melena too. No return of those symptoms. Hgb stable.  Eyes fluttering, headache after Happens sometimes just standing there  No sudden syncope Very similar to last time 09/2022 Last night had less wine, 1 glass vs a bottle Not eating much   Not taking any medications for past 2 weeks  Trouble with vision chronically  Brother lives with  3 cigs a day No drugs  Vomiting this am    Date: 02/01/2023               Patient Name:  Kendra Frost MRN: [id number]  DOB: [birth year] Age / Sex: 42 y.o., female   PCP: Modena Slater, DO         Medical Service: Internal Medicine Teaching Service         Attending Physician: Dr. Glendora Score, MD      First Contact: Dr. Lovie Macadamia, MD [phone]    Second Contact: Dr. Rocky Morel, DO Pager [phone]         After Hours (After 5p/  First Contact Pager: [phone]  weekends / holidays): Second Contact Pager: [phone]   SUBJECTIVE   Chief Complaint: Syncope  History of Present Illness:  Kendra Frost is a 42 year old female with PMH of alcohol use disorder who presented after a syncopal episode today at work.  She was at work today when she had a few episodes of lightheadedness and blurry vision and then after leaning down to put some labels on something at work she stood up and about 2-3 minutes later became lightheaded and dizzy and then passed out.  She did not have any significant injuries from this.  She did have some vomiting this morning which she has occasionally.  She does not eat or drink very much and her last meal was about 16-18 hours prior to this episode.  She drinks a bottle of wine every night but had slightly less last night and  had 1 to 2 glasses.  She denies any sudden syncope, chest pain, shortness of breath, associated headache, lower extremity edema, or other worrisome symptoms.  Of note she also presented with abnormal uterine bleeding and melena in May of this year but has not had any return of her symptoms recently.  ED Course: Presented as above with mild hypertension but otherwise stable and normal vital signs.  Initial assessment was consistent with orthostatic syncope due to decreased oral intake without significant electrolyte abnormalities or AKI.  No signs or symptoms of blood loss.  EKG stable and initially troponin was normal at 8 but repeat troponin rose to 29.  At this point IMTS was paged for admission.  Past Medical History Alcohol use disorder  Meds:  Prescribed naltrexone and pantoprazole but not taking any meds.  Past Surgical History History reviewed. No pertinent surgical history.  Social:  Lives with brother here in [place].  Independent in ADLs and IADLs.  Works at [employer].  Smokes 3 to 5 cigarettes/day.  Drinks a bottle of wine nightly. PCP: Modena Slater, DO  Family History:  History reviewed. No pertinent family history.  Allergies: Allergies as of 02/01/2023   (No Known Allergies)    Review of Systems: A complete ROS was negative  except as per HPI.   OBJECTIVE:   Physical Exam: Blood pressure (!) 118/91, pulse 87, temperature 98 F (36.7 C), temperature source Oral, resp. rate 20, height 5\' 1"  (1.549 m), weight 48.5 kg, last menstrual period 12/31/2022, SpO2 100%.  Constitutional: Well-appearing adult female laying in bed. In no acute distress. HENT: Normocephalic, atraumatic,  Eyes: Sclera non-icteric, PERRL, EOM intact Neck:normal atraumatic, no jvd Cardio:Regular rate and rhythm. No murmurs, rubs, or gallops. 2+ bilateral radial and dorsalis pedis  pulses. Pulm:Clear to auscultation bilaterally. Normal work of breathing on room air. Abdomen: Soft, non-tender,  non-distended, positive bowel sounds. WUJ:[id number] for extremity edema. Skin:Warm and dry. Neuro:Alert and oriented x3. No focal deficit noted. Psych:Pleasant mood and affect.  Labs: CBC    Component Value Date/Time   WBC 5.1 02/01/2023 1227   RBC 3.96 02/01/2023 1227   HGB 13.7 02/01/2023 1227   HGB 12.5 09/28/2022 1112   HCT 39.8 02/01/2023 1227   HCT 37.9 09/28/2022 1112   PLT 141 (L) 02/01/2023 1227   PLT 426 09/28/2022 1112   MCV 100.5 (H) 02/01/2023 1227   MCV 100 (H) 09/28/2022 1112   MCH 34.6 (H) 02/01/2023 1227   MCHC 34.4 02/01/2023 1227   RDW 13.2 02/01/2023 1227   RDW 12.5 09/28/2022 1112   LYMPHSABS 1.4 02/01/2023 1227   MONOABS 0.4 02/01/2023 1227   EOSABS 0.1 02/01/2023 1227   BASOSABS 0.1 02/01/2023 1227     CMP     Component Value Date/Time   NA 136 02/01/2023 1436   K 3.9 02/01/2023 1436   CL 101 02/01/2023 1436   CO2 23 02/01/2023 1436   GLUCOSE 89 02/01/2023 1436   BUN 7 02/01/2023 1436   CREATININE 0.61 02/01/2023 1436   CALCIUM 9.0 02/01/2023 1436   PROT 7.4 02/01/2023 1436   ALBUMIN 3.8 02/01/2023 1436   AST 121 (H) 02/01/2023 1436   ALT 73 (H) 02/01/2023 1436   ALKPHOS 95 02/01/2023 1436   BILITOT 0.8 02/01/2023 1436   GFRNONAA >60 02/01/2023 1436    EKG: personally reviewed my interpretation is normal sinus rhythm. Consistent with prior EKG.  ASSESSMENT & PLAN:   Assessment & Plan by Problem: Active Problems:   * No active hospital problems. *   Renato Gails Laurissa Gobbi is a 42 y.o. female with pertinent PMH of *** who presented with *** and is admitted for ***.  *** ***  *** ***  *** ***  *** ***  *** ***  *** ***  Diet: {NAMES:3044014::"Normal","Heart Healthy","Carb-Modified","Renal","Carb/Renal","NPO","TPN","Tube Feeds"} VTE: {NAMES:3044014::"Heparin","Enoxaparin","SCDs","DOAC","None"} IVF: {NAMES:3044014::"None","NS","1/2  NS","LR","D5","D10"},{NAMES:3044014::"None","10cc/hr","25cc/hr","50cc/hr","75cc/hr","100cc/hr","110cc/hr","125cc/hr","Bolus"} Code: {NAMES:3044014::"Full","DNR","DNI","DNR/DNI","Comfort Care","Unknown"}  Dispo: Admit patient to {STATUS:3044014::"Observation with expected length of stay less than 2 midnights.","Inpatient with expected length of stay greater than 2 midnights."}  Signed: Rocky Morel, DO Internal Medicine Resident PGY-2  02/01/2023, 5:10 PM   {Intern/Pager24/25:[id number]}

## 2023-02-01 NOTE — ED Notes (Signed)
Walked patient to the bathroom patient did well

## 2023-02-01 NOTE — ED Provider Notes (Signed)
[place] EMERGENCY DEPARTMENT AT [facility] Provider Note  CSN: [id number] Arrival date & time: 02/01/23 1200  Chief Complaint(s) Loss of Consciousness  HPI Kendra Frost is a 42 y.o. female with PMH alcohol abuse, abnormal uterine bleeding, hypothyroidism who presents emergency room for evaluation of a syncopal episode.  Patient states that this is not her first syncopal episode and this occurred while at work today.  She states that she bent down to place labels on something, stood up and had a syncopal episode.  Did have a prodrome prior to this where she felt like she was going to pass out.  No associated chest pain, shortness of breath, Donnell pain, nausea, vomiting or other systemic symptoms.  Does endorse drinking 1 bottle of wine daily.  Last drink was this morning.   Past Medical History Past Medical History:  Diagnosis Date   Alcohol abuse    Thyroid disease    Patient Active Problem List   Diagnosis Date Noted   Dark stools 10/17/2022   Vaginal bleeding problems 10/17/2022   Screening for cervical cancer 10/17/2022   Alcohol withdrawal syndrome without complication (HCC) 09/20/2022   Hypothyroidism 09/20/2022   Alcohol use disorder 09/20/2022   Home Medication(s) Prior to Admission medications   Medication Sig Start Date End Date Taking? Authorizing Provider  acetaminophen (TYLENOL) 500 MG tablet Take 2 tablets (1,000 mg total) by mouth 3 (three) times daily as needed for mild pain or headache. 10/03/22   [name], MD  folic acid (FOLVITE) 1 MG tablet Take 1 tablet (1 mg total) by mouth daily. 10/17/22 10/17/23  Modena Slater, DO  Multiple Vitamins-Minerals (CERTAVITE/ANTIOXIDANTS) TABS Take 1 tablet by mouth daily. 10/17/22 10/17/23  Modena Slater, DO  pantoprazole (PROTONIX) 40 MG tablet Take 1 tablet (40 mg total) by mouth daily. 10/17/22 01/15/23  Modena Slater, DO                                                                                                                                     Past Surgical History History reviewed. No pertinent surgical history. Family History History reviewed. No pertinent family history.  Social History Social History   Tobacco Use   Smoking status: Every Day    Current packs/day: 0.22    Types: Cigarettes   Smokeless tobacco: Never  Substance Use Topics   Alcohol use: Yes    Alcohol/week: 3.0 standard drinks of alcohol    Types: 3 Glasses of wine per week   Drug use: Never   Allergies Patient has no known allergies.  Review of Systems Review of Systems  Neurological:  Positive for syncope.    Physical Exam Vital Signs  I have reviewed the triage vital signs BP (!) 133/103 (BP Location: Right Arm)   Pulse 96   Temp 98 F (36.7 C) (Oral)   Resp 12   Ht 5\' 1"  (1.549 m)  Wt 48.5 kg   LMP 12/31/2022   SpO2 100%   BMI 20.22 kg/m   Physical Exam Vitals and nursing note reviewed.  Constitutional:      General: She is not in acute distress.    Appearance: She is well-developed.  HENT:     Head: Normocephalic and atraumatic.  Eyes:     Conjunctiva/sclera: Conjunctivae normal.  Cardiovascular:     Rate and Rhythm: Normal rate and regular rhythm.     Heart sounds: No murmur heard. Pulmonary:     Effort: Pulmonary effort is normal. No respiratory distress.     Breath sounds: Normal breath sounds.  Abdominal:     Palpations: Abdomen is soft.     Tenderness: There is no abdominal tenderness.  Musculoskeletal:        General: No swelling.     Cervical back: Neck supple.  Skin:    General: Skin is warm and dry.     Capillary Refill: Capillary refill takes less than 2 seconds.  Neurological:     Mental Status: She is alert.  Psychiatric:        Mood and Affect: Mood normal.     ED Results and Treatments Labs (all labs ordered are listed, but only abnormal results are displayed) Labs Reviewed  CBC WITH DIFFERENTIAL/PLATELET - Abnormal; Notable for the  following components:      Result Value   MCV 100.5 (*)    MCH 34.6 (*)    Platelets 141 (*)    All other components within normal limits  COMPREHENSIVE METABOLIC PANEL  TROPONIN I (HIGH SENSITIVITY)                                                                                                                          Radiology No results found.  Pertinent labs & imaging results that were available during my care of the patient were reviewed by me and considered in my medical decision making (see MDM for details).  Medications Ordered in ED Medications  lactated ringers bolus 1,000 mL (1,000 mLs Intravenous New Bag/Given 02/01/23 1229)                                                                                                                                     Procedures Procedures  (including critical care time)  Medical Decision Making / ED Course   This patient presents  to the ED for concern of syncope, this involves an extensive number of treatment options, and is a complaint that carries with it a high risk of complications and morbidity.  The differential diagnosis includes orthostatic syncope, cardiogenic syncope, vasovagal syncope, electrolyte abnormality, dehydration, dysrhythmia, vasovagal, Hypoglycemia, Seizure, Autonomic Insufficiency  MDM: Patient seen emergency room for evaluation of the syncopal episode.  Physical exam is largely unremarkable with no focal motor or sensory deficits, cardiopulmonary exam unremarkable.  Laboratory evaluation largely unremarkable with a normal negative high-sensitivity troponin.   ECG nonischemic with no evidence of WPW or Brugada.  At time of signout, patient pending completion of laboratory evaluation with CMP and orthostatic vital signs after fluid resuscitation.  Please see provider signout for continuation of workup.   Additional history obtained:  -External records from outside source obtained and reviewed including: Chart  review including previous notes, labs, imaging, consultation notes   Lab Tests: -I ordered, reviewed, and interpreted labs.   The pertinent results include:   Labs Reviewed  CBC WITH DIFFERENTIAL/PLATELET - Abnormal; Notable for the following components:      Result Value   MCV 100.5 (*)    MCH 34.6 (*)    Platelets 141 (*)    All other components within normal limits  COMPREHENSIVE METABOLIC PANEL  TROPONIN I (HIGH SENSITIVITY)      EKG   EKG Interpretation Date/Time:  Friday February 01 2023 12:12:15 EDT Ventricular Rate:  95 PR Interval:  189 QRS Duration:  89 QT Interval:  392 QTC Calculation: 493 R Axis:   23  Text Interpretation: Sinus rhythm  \ Confirmed by Ly Bacchi ([id number]) on 02/01/2023 1:50:58 PM          Medicines ordered and prescription drug management: Meds ordered this encounter  Medications   lactated ringers bolus 1,000 mL    -I have reviewed the patients home medicines and have made adjustments as needed  Critical interventions none     Cardiac Monitoring: The patient was maintained on a cardiac monitor.  I personally viewed and interpreted the cardiac monitored which showed an underlying rhythm of: NSR  Social Determinants of Health:  Factors impacting patients care include: Daily alcohol use   Reevaluation: After the interventions noted above, I reevaluated the patient and found that they have :improved  Co morbidities that complicate the patient evaluation  Past Medical History:  Diagnosis Date   Alcohol abuse    Thyroid disease       Dispostion: I considered admission for this patient, and disposition pending completion of laboratory evaluation.  Please see provider signout note for continuation of workup     Final Clinical Impression(s) / ED Diagnoses Final diagnoses:  None     @PCDICTATION @    Glendora Score, MD 02/01/23 [phone]

## 2023-02-01 NOTE — ED Notes (Signed)
Got patient on the monitor into a gown did EKG shown to er provider patient is resting with call bell in reach got patient a warm blanket

## 2023-02-01 NOTE — ED Provider Notes (Signed)
Received patient in turnover from Dr. Posey Rea.  Please see their note for further details of Hx, PE.  Briefly patient is a 42 y.o. female with a Loss of Consciousness .  Patient had a syncopal event when standing up suddenly.  Thought to be due to orthostasis.  Plan for laboratory evaluation and likely discharge home.  Delta trop elevated.  Patient without any chest discomfort.  My history of the event sounds more vasovagal but with an elevated troponin must considered cardiogenic syncope.  I think less likely to be pulmonary embolism.  Will discuss with medicine for admission.  The patient was evaluated by the internal medicine residency service.  They felt that she was safe for discharge as she recently had a admission for something similar back in May.  I asked them if they wanted a third troponin drawn which they had declined.  Will have her follow-up with them in clinic.  Encouraged her to return for worsening symptoms.    Melene Plan, DO 02/01/23 1845

## 2023-02-01 NOTE — Consult Note (Addendum)
Date: 02/01/2023               Patient Name:  Kendra Frost MRN: [id number]  DOB: Apr 14, 1981 Age / Sex: 42 y.o., female   PCP: Modena Slater, DO         Requesting Physician: Dr. Glendora Score, MD     SUBJECTIVE   Chief Complaint: Syncope  History of Present Illness:  Kendra Frost is a 42 year old female with PMH of alcohol use disorder who presented after a syncopal episode today at work.  She was at work today when she had a few episodes of lightheadedness and blurry vision and then after leaning down to put some labels on something at work she stood up and about 2-3 minutes later became lightheaded and dizzy and then passed out.  She did not have any significant injuries from this.  She did have some vomiting this morning which she has occasionally.  She does not eat or drink very much and her last meal was about 16-18 hours prior to this episode.  She drinks a bottle of wine every night but had slightly less last night and had 1 to 2 glasses.  She denies any sudden syncope, chest pain, shortness of breath, associated headache, lower extremity edema, or other worrisome symptoms.  Of note she also presented with abnormal uterine bleeding and melena in May of this year but has not had any return of her symptoms recently.  ED Course: Presented as above with mild hypertension but otherwise stable and normal vital signs.  Initial assessment was consistent with orthostatic syncope due to decreased oral intake without significant electrolyte abnormalities or AKI.  No signs or symptoms of blood loss.  EKG stable and initially troponin was normal at 8 but repeat troponin rose to 29.  At this point IMTS was paged for admission.  Past Medical History Alcohol use disorder  Meds:  Prescribed naltrexone and pantoprazole but not taking any meds.  Past Surgical History History reviewed. No pertinent surgical history.  Social:  Lives with brother here in [place].  Independent in  ADLs and IADLs.  Works at [employer].  Smokes 3 to 5 cigarettes/day.  Drinks a bottle of wine nightly. PCP: Modena Slater, DO  Family History:  History reviewed. No pertinent family history.  Allergies: Allergies as of 02/01/2023   (No Known Allergies)    Review of Systems: A complete ROS was negative except as per HPI.   OBJECTIVE:   Physical Exam: Blood pressure (!) 118/91, pulse 87, temperature 98 F (36.7 C), temperature source Oral, resp. rate 20, height 5\' 1"  (1.549 m), weight 48.5 kg, last menstrual period 12/31/2022, SpO2 100%.  Constitutional: Well-appearing adult female laying in bed. In no acute distress. HENT: Normocephalic, atraumatic,  Eyes: Sclera non-icteric, PERRL, EOM intact Neck:normal atraumatic, no jvd Cardio:Regular rate and rhythm. No murmurs, rubs, or gallops. 2+ bilateral radial and dorsalis pedis  pulses. Pulm:Clear to auscultation bilaterally. Normal work of breathing on room air. Abdomen: Soft, non-tender, non-distended, positive bowel sounds. WGN:FAOZHYQM for extremity edema. Skin:Warm and dry. Neuro:Alert and oriented x3. No focal deficit noted. Psych:Pleasant mood and affect.  Labs: CBC    Component Value Date/Time   WBC 5.1 02/01/2023 1227   RBC 3.96 02/01/2023 1227   HGB 13.7 02/01/2023 1227   HGB 12.5 09/28/2022 1112   HCT 39.8 02/01/2023 1227   HCT 37.9 09/28/2022 1112   PLT 141 (L) 02/01/2023 1227   PLT 426 09/28/2022 1112   MCV 100.5 (H)  02/01/2023 1227   MCV 100 (H) 09/28/2022 1112   MCH 34.6 (H) 02/01/2023 1227   MCHC 34.4 02/01/2023 1227   RDW 13.2 02/01/2023 1227   RDW 12.5 09/28/2022 1112   LYMPHSABS 1.4 02/01/2023 1227   MONOABS 0.4 02/01/2023 1227   EOSABS 0.1 02/01/2023 1227   BASOSABS 0.1 02/01/2023 1227     CMP     Component Value Date/Time   NA 136 02/01/2023 1436   K 3.9 02/01/2023 1436   CL 101 02/01/2023 1436   CO2 23 02/01/2023 1436   GLUCOSE 89 02/01/2023 1436   BUN 7 02/01/2023 1436   CREATININE 0.61  02/01/2023 1436   CALCIUM 9.0 02/01/2023 1436   PROT 7.4 02/01/2023 1436   ALBUMIN 3.8 02/01/2023 1436   AST 121 (H) 02/01/2023 1436   ALT 73 (H) 02/01/2023 1436   ALKPHOS 95 02/01/2023 1436   BILITOT 0.8 02/01/2023 1436   GFRNONAA >60 02/01/2023 1436    EKG: personally reviewed my interpretation is normal sinus rhythm. Consistent with prior EKG.  ASSESSMENT & PLAN:   Assessment & Plan by Problem:  Kendra Frost is a 42 y.o. female with pertinent PMH of alcohol use disorder who presented with lightheadedness, blurry vision, and syncope and was evaluated in consult for orthostatic syncope.  Orthostatic syncope History and physical consistent with orthostatic syncope due to poor oral intake.  She has been admitted for this problem in the past, 09/19/2022, however at that time it was complicated by tapered alcohol use and signs of tonic-clonic movement after passing out.  At this time she has not been tapering off alcohol and there was no reported seizure-like activity.  Electrolytes, renal function, glucose, hemoglobin,, EKG, and initial troponin are stable.  Second troponin came back at 29.  Patient did not have any other signs or symptoms of ACS other than her syncope and compared to her prior admission she had a small bump in her troponin to 28.  At that time CTA of the chest for PE, echo, orthostatics, and MRI of the brain were all without acute findings.  Due to her convincing story, consistent symptoms, recent decreased p.o. intake, vomiting this morning, and negative orthostatics after p.o. hydration we are okay with discharge home and close PCP follow-up.  Low suspicion for cardiac syncope, seizure, PE.  [place] syncope risk score of -1 indicating low risk with a 1.2% risk of 30-day serious adverse events.  Discussed the need for adequate hydration and nutrition.  Also discussed strict return precautions.  Patient understands.  Patient also requested referral to  ophthalmology and OB/GYN on PCP follow-up.  Patient discussed with attending physician, Dr. Mercie Eon, who also came to assess the patient.  Please see attestation for any further details.  Signed: Rocky Morel, DO Internal Medicine Resident PGY-2  02/01/2023, 6:08 PM

## 2023-02-01 NOTE — ED Notes (Signed)
Consult team is

## 2024-07-31 ENCOUNTER — Ambulatory Visit: Payer: Self-pay | Admitting: Student
